# Patient Record
Sex: Male | Born: 1975 | Race: White | Hispanic: No | Marital: Single | Smoking: Never smoker
Health system: Southern US, Community
[De-identification: ages and names within clinical notes are randomized; demographics above are authoritative.]

## PROBLEM LIST (undated history)

## (undated) DIAGNOSIS — E119 Type 2 diabetes mellitus without complications: Secondary | ICD-10-CM

## (undated) DIAGNOSIS — M549 Dorsalgia, unspecified: Secondary | ICD-10-CM

## (undated) DIAGNOSIS — G8929 Other chronic pain: Secondary | ICD-10-CM

## (undated) DIAGNOSIS — J302 Other seasonal allergic rhinitis: Secondary | ICD-10-CM

## (undated) DIAGNOSIS — M1990 Unspecified osteoarthritis, unspecified site: Secondary | ICD-10-CM

## (undated) DIAGNOSIS — M2550 Pain in unspecified joint: Secondary | ICD-10-CM

## (undated) DIAGNOSIS — Z8669 Personal history of other diseases of the nervous system and sense organs: Secondary | ICD-10-CM

## (undated) DIAGNOSIS — M2540 Effusion, unspecified joint: Secondary | ICD-10-CM

## (undated) HISTORY — DX: Other seasonal allergic rhinitis: J30.2

## (undated) HISTORY — DX: Unspecified osteoarthritis, unspecified site: M19.90

## (undated) HISTORY — DX: Type 2 diabetes mellitus without complications: E11.9

---

## 1998-12-18 HISTORY — PX: WRIST SURGERY: SHX841

## 2011-12-05 ENCOUNTER — Encounter: Payer: Self-pay | Admitting: Family Medicine

## 2011-12-05 ENCOUNTER — Ambulatory Visit (INDEPENDENT_AMBULATORY_CARE_PROVIDER_SITE_OTHER): Payer: PRIVATE HEALTH INSURANCE | Admitting: Family Medicine

## 2011-12-05 VITALS — BP 110/92 | HR 94 | Temp 98.7°F | Resp 12 | Ht 69.5 in | Wt 286.0 lb

## 2011-12-05 DIAGNOSIS — G43909 Migraine, unspecified, not intractable, without status migrainosus: Secondary | ICD-10-CM

## 2011-12-05 DIAGNOSIS — Z23 Encounter for immunization: Secondary | ICD-10-CM

## 2011-12-05 DIAGNOSIS — Z0000 Encounter for general adult medical examination without abnormal findings: Secondary | ICD-10-CM

## 2011-12-05 DIAGNOSIS — K219 Gastro-esophageal reflux disease without esophagitis: Secondary | ICD-10-CM

## 2011-12-05 LAB — BASIC METABOLIC PANEL
BUN: 16 mg/dL (ref 6–23)
CO2: 26 mEq/L (ref 19–32)
Chloride: 107 mEq/L (ref 96–112)
Creatinine, Ser: 0.8 mg/dL (ref 0.4–1.5)

## 2011-12-05 LAB — CBC WITH DIFFERENTIAL/PLATELET
Eosinophils Relative: 3.3 % (ref 0.0–5.0)
HCT: 45.3 % (ref 39.0–52.0)
Hemoglobin: 15.7 g/dL (ref 13.0–17.0)
Lymphocytes Relative: 25.1 % (ref 12.0–46.0)
Lymphs Abs: 1.9 10*3/uL (ref 0.7–4.0)
Monocytes Relative: 8.2 % (ref 3.0–12.0)
Platelets: 284 10*3/uL (ref 150.0–400.0)
WBC: 7.4 10*3/uL (ref 4.5–10.5)

## 2011-12-05 LAB — HEPATIC FUNCTION PANEL
AST: 21 U/L (ref 0–37)
Albumin: 4.2 g/dL (ref 3.5–5.2)
Alkaline Phosphatase: 56 U/L (ref 39–117)
Bilirubin, Direct: 0 mg/dL (ref 0.0–0.3)

## 2011-12-05 LAB — LIPID PANEL: Total CHOL/HDL Ratio: 6

## 2011-12-05 NOTE — Progress Notes (Signed)
  Subjective:    Patient ID: Shane Brewer, male    DOB: 05-Jan-1976, 35 y.o.   MRN: [id number]  HPI  New to establish care for well visit. Past medical history reviewed. History of GERD which is controlled with over-the-counter medications -which he takes rarely. History of migraine headaches which are very rare most recent about 3 years ago. Prior history of scaphoid fracture requiring surgery in 2000. Takes no medications. No known drug allergies. No history of smoking. No consistent alcohol use.  Family history significant for both parents with osteoarthritis. Paternal grandfather with heart disease in his 55s.  Patient is single. 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 No consistent exercise.  Past Medical History  Diagnosis Date  . Arthritis   . GERD (gastroesophageal reflux disease)   . Migraine    Past Surgical History  Procedure Date  . Wrist surgery 2000    right wrist injury    reports that he has never smoked. He does not have any smokeless tobacco history on file. His alcohol and drug histories not on file. family history includes Alcohol abuse in his maternal uncle and paternal grandfather; Arthritis in his father and mother; Diabetes in his maternal grandmother; Heart disease in his maternal grandmother and paternal grandfather; Hypertension in his maternal grandmother and paternal grandfather; and Stroke in his maternal grandmother. No Known Allergies    Review of Systems  Constitutional: Negative for fever, activity change, appetite change and fatigue.  HENT: Negative for ear pain, congestion and trouble swallowing.   Eyes: Negative for pain and visual disturbance.  Respiratory: Negative for cough, shortness of breath and wheezing.   Cardiovascular: Negative for chest pain and palpitations.  Gastrointestinal: Negative for nausea, vomiting, abdominal pain, diarrhea, constipation, blood in stool, abdominal distention and rectal pain.  Genitourinary: Negative for dysuria,  hematuria and testicular pain.  Musculoskeletal: Negative for joint swelling and arthralgias.  Skin: Negative for rash.  Neurological: Negative for dizziness, syncope and headaches.  Hematological: Negative for adenopathy.  Psychiatric/Behavioral: Negative for confusion and dysphoric mood.       Objective:   Physical Exam  Constitutional: He is oriented to person, place, and time. He appears well-developed and well-nourished. No distress.  HENT:  Head: Normocephalic and atraumatic.  Right Ear: External ear normal.  Left Ear: External ear normal.  Mouth/Throat: Oropharynx is clear and moist.  Eyes: Conjunctivae and EOM are normal. Pupils are equal, round, and reactive to light.  Neck: Normal range of motion. Neck supple. No thyromegaly present.  Cardiovascular: Normal rate, regular rhythm and normal heart sounds.   No murmur heard. Pulmonary/Chest: No respiratory distress. He has no wheezes. He has no rales.  Abdominal: Soft. Bowel sounds are normal. He exhibits no distension and no mass. There is no tenderness. There is no rebound and no guarding.  Musculoskeletal: He exhibits no edema.  Lymphadenopathy:    He has no cervical adenopathy.  Neurological: He is alert and oriented to person, place, and time. He displays normal reflexes. No cranial nerve deficit.  Skin: No rash noted.  Psychiatric: He has a normal mood and affect.          Assessment & Plan:  Complete physical. Establish baseline labs. Work on weight loss and more consistent exercise. Flu vaccine given.

## 2011-12-05 NOTE — Patient Instructions (Signed)
Work on establishing regular exercise and weight loss.

## 2011-12-07 LAB — TSH: TSH: 1.18 u[IU]/mL (ref 0.35–5.50)

## 2011-12-11 ENCOUNTER — Other Ambulatory Visit: Payer: Self-pay | Admitting: Family Medicine

## 2011-12-11 DIAGNOSIS — E785 Hyperlipidemia, unspecified: Secondary | ICD-10-CM

## 2011-12-11 NOTE — Progress Notes (Signed)
Quick Note:  Pt informed on VM, labs ordered ______

## 2011-12-28 ENCOUNTER — Encounter: Payer: Self-pay | Admitting: Family Medicine

## 2011-12-28 ENCOUNTER — Ambulatory Visit (INDEPENDENT_AMBULATORY_CARE_PROVIDER_SITE_OTHER): Payer: PRIVATE HEALTH INSURANCE | Admitting: Family Medicine

## 2011-12-28 VITALS — BP 110/80 | Temp 98.8°F | Wt 276.0 lb

## 2011-12-28 DIAGNOSIS — M722 Plantar fascial fibromatosis: Secondary | ICD-10-CM

## 2011-12-28 MED ORDER — DICLOFENAC SODIUM 75 MG PO TBEC: 75.0000 mg | DELAYED_RELEASE_TABLET | Freq: Two times a day (BID) | ORAL | Status: DC

## 2011-12-28 NOTE — Progress Notes (Signed)
  Subjective:    Patient ID: Shane Brewer, male    DOB: 1976-03-18, 36 y.o.   MRN: [id number]  HPI  Acute visit. 3 week history left foot pain. Location is plantar fascia along the mid to distal portion. No injury. Using arch supports with minimal relief. No redness or bruising. Tends to wear very supportive shoes. No history of similar problem. Has not tried any icing. Aleve without much relief   Review of Systems As per history of present illness    Objective:   Physical Exam  Constitutional: He appears well-developed and well-nourished.  Cardiovascular: Normal rate and regular rhythm.   Pulmonary/Chest: Effort normal and breath sounds normal. No respiratory distress. He has no wheezes. He has no rales.  Musculoskeletal: He exhibits no edema.       Left foot reveals no edema. Mild tenderness mid plantar fascia. No palpable defects or swelling. No ecchymosis. Achilles intact. No dorsal foot pain          Assessment & Plan:  Left mid plantar fascia foot pain. Doubt rupture. Continue inserts for support. Diclofenac 75 mgs twice a day with food. Touch base one week if no better

## 2011-12-28 NOTE — Patient Instructions (Signed)
Continue with arch supports. Be in touch in 1-2 weeks if no better.

## 2012-05-23 ENCOUNTER — Ambulatory Visit (INDEPENDENT_AMBULATORY_CARE_PROVIDER_SITE_OTHER): Payer: PRIVATE HEALTH INSURANCE | Admitting: Family Medicine

## 2012-05-23 ENCOUNTER — Encounter: Payer: Self-pay | Admitting: Family Medicine

## 2012-05-23 VITALS — BP 100/72 | Temp 97.6°F | Wt 289.0 lb

## 2012-05-23 DIAGNOSIS — D485 Neoplasm of uncertain behavior of skin: Secondary | ICD-10-CM

## 2012-05-23 NOTE — Progress Notes (Signed)
  Subjective:    Patient ID: Shane Brewer, male    DOB: 05/23/1976, 36 y.o.   MRN: [id number]  HPI  Irritated mole right side. He states was brownish in color 2 days ago noticed some soreness and redness. No known injury. No active bleeding. No history of skin cancer. No other concerning lesions noted.   Review of Systems  Constitutional: Negative for appetite change and unexpected weight change.  Hematological: Negative for adenopathy.       Objective:   Physical Exam  Constitutional: He appears well-developed and well-nourished.  Cardiovascular: Normal rate and regular rhythm.   Pulmonary/Chest: Effort normal and breath sounds normal. No respiratory distress. He has no wheezes. He has no rales.  Skin:       Right anterior to lateral rib cage area reveals approximately 3 mm slightly raised well demarcated erythematous nodular lesion. Good symmetry          Assessment & Plan:  Irritated skin lesion. Question angioma versus traumatized benign nevus Discussed risk and benefits of shave excision and patient consented. Prepped with Betadine. Anesthesia 1% Xylocaine with epinephrine. Shave with #15 blade. Minimal bleeding. Topical antibiotic and dressing applied. Specimen sent to pathologist for further evaluation

## 2012-05-23 NOTE — Patient Instructions (Signed)
Keep wound dry for the first 24 hours then clean daily with soap and water for one week. Apply topical antibiotic daily for 3-4 days. Keep covered with clean dressing for 4-5 days. Follow up promptly for any signs of infection such as redness, warmth, pain, or drainage.  

## 2012-05-28 NOTE — Progress Notes (Signed)
Quick Note:  Pt informed on personally identified VM ______ 

## 2012-08-17 ENCOUNTER — Ambulatory Visit: Payer: Self-pay | Admitting: Internal Medicine

## 2013-02-04 ENCOUNTER — Encounter: Payer: Self-pay | Admitting: Internal Medicine

## 2013-02-04 ENCOUNTER — Ambulatory Visit (INDEPENDENT_AMBULATORY_CARE_PROVIDER_SITE_OTHER): Payer: PRIVATE HEALTH INSURANCE | Admitting: Internal Medicine

## 2013-02-04 VITALS — BP 122/84 | Temp 97.9°F | Wt 291.0 lb

## 2013-02-04 DIAGNOSIS — K5289 Other specified noninfective gastroenteritis and colitis: Secondary | ICD-10-CM

## 2013-02-04 DIAGNOSIS — K529 Noninfective gastroenteritis and colitis, unspecified: Secondary | ICD-10-CM

## 2013-02-04 MED ORDER — ONDANSETRON HCL 4 MG PO TABS: 4.0000 mg | ORAL_TABLET | Freq: Three times a day (TID) | ORAL | Status: DC | PRN

## 2013-02-04 NOTE — Patient Instructions (Addendum)
Increase fluid intake and follow bland diet for next 3-5 days. Please contact our office if your symptoms do not improve or gets worse.

## 2013-02-04 NOTE — Assessment & Plan Note (Signed)
37 year old white male with signs and symptoms of viral gastroenteritis. 4 episodes of vomiting today. He denies any diarrhea. Treat with Zofran 4 mg every 8 hours as needed. Patient advised push fluids and follow bland diet.  Patient advised to call office if symptoms persist or worsen.

## 2013-02-04 NOTE — Progress Notes (Signed)
  Subjective:    Patient ID: Shane Brewer, male    DOB: 03/31/1976, 37 y.o.   MRN: [id number]  HPI  37 year old white male with history of GERD and obesity complains of nausea and vomiting for 24 hours. He has vomited 4 times today.  Patient denies any associated abdominal pain. He denies any diarrhea.  Patient reports he was diagnosed with influenza 2 weeks ago. His symptoms improved with using course of Tamiflu.  He denies any unusual food intake. He denies any recent travel.  Review of Systems Negative for fever chills, negative for diarrhea    Past Medical History  Diagnosis Date  . Arthritis   . GERD (gastroesophageal reflux disease)   . Migraine     History   Social History  . Marital Status: Single    Spouse Name: N/A    Number of Children: N/A  . Years of Education: N/A   Occupational History  . Not on file.   Social History Main Topics  . Smoking status: Never Smoker   . Smokeless tobacco: Not on file  . Alcohol Use: Not on file  . Drug Use: Not on file  . Sexually Active: Not on file   Other Topics Concern  . Not on file   Social History Narrative  . No narrative on file    Past Surgical History  Procedure Laterality Date  . Wrist surgery  2000    right wrist injury    Family History  Problem Relation Age of Onset  . Arthritis Mother   . Arthritis Father   . Alcohol abuse Maternal Uncle   . Heart disease Maternal Grandmother   . Hypertension Maternal Grandmother   . Stroke Maternal Grandmother   . Diabetes Maternal Grandmother   . Heart disease Paternal Grandfather   . Hypertension Paternal Grandfather   . Alcohol abuse Paternal Grandfather     No Known Allergies  No current outpatient prescriptions on file prior to visit.   No current facility-administered medications on file prior to visit.    BP 122/84  Temp(Src) 97.9 F (36.6 C) (Oral)  Wt 291 lb (131.997 kg)  BMI 42.37 kg/m2    Objective:   Physical Exam   Constitutional: He is oriented to person, place, and time.  Pleasant, obese 37 y/o  HENT:  Head: Normocephalic and atraumatic.  Right Ear: External ear normal.  Left Ear: External ear normal.  Mouth/Throat: Oropharynx is clear and moist.  Cardiovascular: Normal rate and normal heart sounds.   Pulmonary/Chest: Effort normal and breath sounds normal. He has no wheezes.  Abdominal: Bowel sounds are normal. He exhibits no mass. There is no tenderness.  Neurological: He is alert and oriented to person, place, and time. No cranial nerve deficit.  Psychiatric: He has a normal mood and affect. His behavior is normal.          Assessment & Plan:

## 2013-04-22 ENCOUNTER — Other Ambulatory Visit (INDEPENDENT_AMBULATORY_CARE_PROVIDER_SITE_OTHER): Payer: PRIVATE HEALTH INSURANCE

## 2013-04-22 DIAGNOSIS — Z0000 Encounter for general adult medical examination without abnormal findings: Secondary | ICD-10-CM

## 2013-04-22 LAB — CBC WITH DIFFERENTIAL/PLATELET
Basophils Absolute: 0 10*3/uL (ref 0.0–0.1)
Eosinophils Absolute: 0.2 10*3/uL (ref 0.0–0.7)
Lymphocytes Relative: 26.4 % (ref 12.0–46.0)
MCHC: 34.6 g/dL (ref 30.0–36.0)
MCV: 86.9 fl (ref 78.0–100.0)
Monocytes Absolute: 0.7 10*3/uL (ref 0.1–1.0)
Neutrophils Relative %: 64.4 % (ref 43.0–77.0)
Platelets: 308 10*3/uL (ref 150.0–400.0)

## 2013-04-22 LAB — POCT URINALYSIS DIPSTICK
Bilirubin, UA: NEGATIVE
Blood, UA: NEGATIVE
Ketones, UA: NEGATIVE
Leukocytes, UA: NEGATIVE
Spec Grav, UA: 1.025
pH, UA: 5.5

## 2013-04-22 LAB — LIPID PANEL
Cholesterol: 206 mg/dL — ABNORMAL HIGH (ref 0–200)
HDL: 33.4 mg/dL — ABNORMAL LOW (ref >39.00)
Triglycerides: 303 mg/dL — ABNORMAL HIGH (ref 0.0–149.0)

## 2013-04-22 LAB — HEPATIC FUNCTION PANEL
AST: 19 U/L (ref 0–37)
Alkaline Phosphatase: 56 U/L (ref 39–117)
Bilirubin, Direct: 0.1 mg/dL (ref 0.0–0.3)
Total Protein: 7.1 g/dL (ref 6.0–8.3)

## 2013-04-22 LAB — BASIC METABOLIC PANEL
BUN: 10 mg/dL (ref 6–23)
CO2: 26 mEq/L (ref 19–32)
Calcium: 8.6 mg/dL (ref 8.4–10.5)
Chloride: 104 mEq/L (ref 96–112)
Creatinine, Ser: 0.8 mg/dL (ref 0.4–1.5)

## 2013-04-28 ENCOUNTER — Encounter: Payer: Self-pay | Admitting: Family Medicine

## 2013-04-28 ENCOUNTER — Ambulatory Visit (INDEPENDENT_AMBULATORY_CARE_PROVIDER_SITE_OTHER): Payer: PRIVATE HEALTH INSURANCE | Admitting: Family Medicine

## 2013-04-28 VITALS — BP 110/72 | HR 72 | Temp 99.3°F | Resp 12 | Ht 70.0 in | Wt 292.0 lb

## 2013-04-28 DIAGNOSIS — Z0000 Encounter for general adult medical examination without abnormal findings: Secondary | ICD-10-CM

## 2013-04-28 NOTE — Patient Instructions (Addendum)
Try to establish more consistent exercise and lose some weight. 

## 2013-04-28 NOTE — Progress Notes (Signed)
  Subjective:    Patient ID: Shane Brewer, male    DOB: [birth year], 37 y.o.   MRN: [id number]  HPI Patient seen for complete physical He has history of obesity and intermittent GERD symptoms usually controlled with over-the-counter medications Rare migraine headaches but none in several months No regular exercise. Nonsmoker. Very stressful job of Orthoptist  Past Medical History  Diagnosis Date  . Arthritis   . GERD (gastroesophageal reflux disease)   . Migraine    Past Surgical History  Procedure Laterality Date  . Wrist surgery  2000    right wrist injury    reports that he has never smoked. He does not have any smokeless tobacco history on file. His alcohol and drug histories are not on file. family history includes Alcohol abuse in his maternal uncle and paternal grandfather; Arthritis in his father and mother; Diabetes in his maternal grandmother; Heart disease in his maternal grandmother and paternal grandfather; Hypertension in his father, maternal grandmother, and paternal grandfather; and Stroke in his maternal grandmother. No Known Allergies    Review of Systems  Constitutional: Negative for fever, activity change, appetite change and fatigue.  HENT: Negative for ear pain, congestion and trouble swallowing.   Eyes: Negative for pain and visual disturbance.  Respiratory: Negative for cough, shortness of breath and wheezing.   Cardiovascular: Negative for chest pain and palpitations.  Gastrointestinal: Negative for nausea, vomiting, abdominal pain, diarrhea, constipation, blood in stool, abdominal distention and rectal pain.  Genitourinary: Negative for dysuria, hematuria and testicular pain.  Musculoskeletal: Negative for joint swelling and arthralgias.  Skin: Negative for rash.  Neurological: Negative for dizziness, syncope and headaches.  Hematological: Negative for adenopathy.  Psychiatric/Behavioral: Negative for confusion and dysphoric mood.        Objective:   Physical Exam  Constitutional: He is oriented to person, place, and time. He appears well-developed and well-nourished. No distress.  HENT:  Head: Normocephalic and atraumatic.  Right Ear: External ear normal.  Left Ear: External ear normal.  Mouth/Throat: Oropharynx is clear and moist.  Eyes: Conjunctivae and EOM are normal. Pupils are equal, round, and reactive to light.  Neck: Normal range of motion. Neck supple. No thyromegaly present.  Cardiovascular: Normal rate, regular rhythm and normal heart sounds.   No murmur heard. Pulmonary/Chest: No respiratory distress. He has no wheezes. He has no rales.  Abdominal: Soft. Bowel sounds are normal. He exhibits no distension and no mass. There is no tenderness. There is no rebound and no guarding.  Musculoskeletal: He exhibits no edema.  Lymphadenopathy:    He has no cervical adenopathy.  Neurological: He is alert and oriented to person, place, and time. He displays normal reflexes. No cranial nerve deficit.  Skin: No rash noted.  Psychiatric: He has a normal mood and affect.          Assessment & Plan:  Complete physical.   Labs reviewed with patient. He has prediabetes. Needs to lose some weight. We discussed diet and exercise. Establish more consistent exercise program.

## 2013-06-23 ENCOUNTER — Ambulatory Visit (INDEPENDENT_AMBULATORY_CARE_PROVIDER_SITE_OTHER): Payer: PRIVATE HEALTH INSURANCE | Admitting: Family Medicine

## 2013-06-23 ENCOUNTER — Encounter: Payer: Self-pay | Admitting: Family Medicine

## 2013-06-23 VITALS — BP 112/88 | HR 89 | Temp 98.1°F | Resp 18 | Wt 297.0 lb

## 2013-06-23 DIAGNOSIS — M79609 Pain in unspecified limb: Secondary | ICD-10-CM

## 2013-06-23 DIAGNOSIS — M79671 Pain in right foot: Secondary | ICD-10-CM

## 2013-06-23 MED ORDER — DICLOFENAC SODIUM 75 MG PO TBEC: 75.0000 mg | DELAYED_RELEASE_TABLET | Freq: Two times a day (BID) | ORAL | Status: DC

## 2013-06-23 NOTE — Progress Notes (Signed)
  Subjective:    Patient ID: Shane Brewer, male    DOB: Apr 29, 1976, 37 y.o.   MRN: [id number]  HPI Right great toe pain. No recent injury. About 2 weeks ago with squatting and did notice a popping sensation in the toe around the metatarsophalangeal joint. Has not noted any redness or bruising or warmth. No history of gout. Pain with ambulation. Pain mostly centered around the metatarsophalangeal joint. No relief with naproxen. No recent change of shoe wear  Past Medical History  Diagnosis Date  . Arthritis   . GERD (gastroesophageal reflux disease)   . Migraine    Past Surgical History  Procedure Laterality Date  . Wrist surgery  2000    right wrist injury    reports that he has never smoked. He does not have any smokeless tobacco history on file. His alcohol and drug histories are not on file. family history includes Alcohol abuse in his maternal uncle and paternal grandfather; Arthritis in his father and mother; Diabetes in his maternal grandmother; Heart disease in his maternal grandmother and paternal grandfather; Hypertension in his father, maternal grandmother, and paternal grandfather; and Stroke in his maternal grandmother. No Known Allergies     Review of Systems  Constitutional: Negative for fever and chills.  Skin: Negative for rash.       Objective:   Physical Exam  Constitutional: He appears well-developed and well-nourished.  Cardiovascular: Normal rate and regular rhythm.   Musculoskeletal:  Right foot reveals no edema. No ecchymosis. No warmth. No erythema. Tenderness MTP joint.          Assessment & Plan:  Right foot pain. Metatarsophalangeal joint. Question osteoarthritis. No evidence for acute inflammation such as gout. Trial of diclofenac 75 mg twice a day with food. X-rays right foot

## 2013-06-24 ENCOUNTER — Ambulatory Visit (INDEPENDENT_AMBULATORY_CARE_PROVIDER_SITE_OTHER)
Admission: RE | Admit: 2013-06-24 | Discharge: 2013-06-24 | Disposition: A | Discharge status: Home / Self Care | Payer: PRIVATE HEALTH INSURANCE | Source: Ambulatory Visit | Attending: Family Medicine | Admitting: Family Medicine

## 2013-06-24 DIAGNOSIS — M79671 Pain in right foot: Secondary | ICD-10-CM

## 2013-06-24 DIAGNOSIS — M79609 Pain in unspecified limb: Secondary | ICD-10-CM

## 2013-07-19 ENCOUNTER — Encounter (HOSPITAL_COMMUNITY): Payer: Self-pay

## 2013-07-19 ENCOUNTER — Emergency Department (HOSPITAL_COMMUNITY)
Admission: EM | Admit: 2013-07-19 | Discharge: 2013-07-19 | Disposition: A | Discharge status: Home / Self Care | Payer: PRIVATE HEALTH INSURANCE | Attending: Emergency Medicine | Admitting: Emergency Medicine

## 2013-07-19 DIAGNOSIS — Y929 Unspecified place or not applicable: Secondary | ICD-10-CM | POA: Insufficient documentation

## 2013-07-19 DIAGNOSIS — L089 Local infection of the skin and subcutaneous tissue, unspecified: Secondary | ICD-10-CM | POA: Insufficient documentation

## 2013-07-19 DIAGNOSIS — Z79899 Other long term (current) drug therapy: Secondary | ICD-10-CM | POA: Insufficient documentation

## 2013-07-19 DIAGNOSIS — Z8679 Personal history of other diseases of the circulatory system: Secondary | ICD-10-CM | POA: Insufficient documentation

## 2013-07-19 DIAGNOSIS — Z8739 Personal history of other diseases of the musculoskeletal system and connective tissue: Secondary | ICD-10-CM | POA: Insufficient documentation

## 2013-07-19 DIAGNOSIS — Y939 Activity, unspecified: Secondary | ICD-10-CM | POA: Insufficient documentation

## 2013-07-19 DIAGNOSIS — W57XXXA Bitten or stung by nonvenomous insect and other nonvenomous arthropods, initial encounter: Secondary | ICD-10-CM

## 2013-07-19 DIAGNOSIS — L0231 Cutaneous abscess of buttock: Secondary | ICD-10-CM | POA: Insufficient documentation

## 2013-07-19 DIAGNOSIS — S30860A Insect bite (nonvenomous) of lower back and pelvis, initial encounter: Secondary | ICD-10-CM

## 2013-07-19 DIAGNOSIS — Z8719 Personal history of other diseases of the digestive system: Secondary | ICD-10-CM | POA: Insufficient documentation

## 2013-07-19 NOTE — ED Provider Notes (Signed)
Medical screening examination/treatment/procedure(s) were performed by non-physician practitioner and as supervising physician I was immediately available for consultation/collaboration.   Gilda Crease, MD 07/19/13 [phone]

## 2013-07-19 NOTE — ED Notes (Signed)
Pt ambulatory to exam room with steady gait. Pt has small reddened area to top of R buttock. Pt states area burns a little. Pt states he thinks a bug bit him.

## 2013-07-19 NOTE — ED Notes (Signed)
Pt c/o possible spider bite to RT buttock since yesterday.  Denies fever symptoms other than pain.

## 2013-07-19 NOTE — ED Provider Notes (Signed)
CSN: [id number]     Arrival date & time 07/19/13  1525 History  This chart was scribed for non-physician practitioner, Junius Finner, PA-C working with Gilda Crease, MD by Greggory Stallion, ED scribe. This patient was seen in room WTR8/WTR8 and the patient's care was started at 4:58 PM.   Chief Complaint  Patient presents with  . Abscess   The history is provided by the patient. No language interpreter was used.    HPI Comments: Shane Brewer is a 37 y.o. male who presents to the Emergency Department complaining of gradual onset, constant abscess due to a possible spider bite to his right buttock that he noticed yesterday. He states it is sore, burning, constant, 5/10 in right buttock at area of bite. Pt denies fever, nausea and emesis as associated symptoms. He states he hasn't taken anything. No allergies that pt knows of.    Past Medical History  Diagnosis Date  . Arthritis   . GERD (gastroesophageal reflux disease)   . Migraine    Past Surgical History  Procedure Laterality Date  . Wrist surgery  2000    right wrist injury   Family History  Problem Relation Age of Onset  . Arthritis Mother   . Arthritis Father   . Hypertension Father   . Alcohol abuse Maternal Uncle   . Heart disease Maternal Grandmother   . Hypertension Maternal Grandmother   . Stroke Maternal Grandmother   . Diabetes Maternal Grandmother   . Heart disease Paternal Grandfather   . Hypertension Paternal Grandfather   . Alcohol abuse Paternal Grandfather    History  Substance Use Topics  . Smoking status: Never Smoker   . Smokeless tobacco: Not on file  . Alcohol Use: Yes     Comment: occasionally    Review of Systems  Constitutional: Negative for fever.  Gastrointestinal: Negative for nausea and vomiting.  Skin:       Abscess   All other systems reviewed and are negative.    Allergies  Strawberry  Home Medications   Current Outpatient Rx  Name  Route  Sig  Dispense  Refill  .  Multiple Vitamin (MULTIVITAMIN WITH MINERALS) TABS   Oral   Take 1 tablet by mouth daily.          BP 139/86  Pulse 103  Temp(Src) 98.7 F (37.1 C) (Oral)  Resp 18  SpO2 100%  Physical Exam  Nursing note and vitals reviewed. Constitutional: He appears well-developed and well-nourished.  HENT:  Head: Normocephalic and atraumatic.  Eyes: Conjunctivae are normal. No scleral icterus.  Neck: Normal range of motion.  Cardiovascular: Normal rate, regular rhythm and normal heart sounds.   Pulmonary/Chest: Effort normal and breath sounds normal. No respiratory distress. He has no wheezes. He has no rales. He exhibits no tenderness.  Abdominal: Soft. Bowel sounds are normal. He exhibits no distension and no mass. There is no tenderness. There is no rebound and no guarding.  Musculoskeletal: Normal range of motion.  Neurological: He is alert.  Skin: Skin is warm and dry. There is erythema.       ED Course   Procedures (including critical care time)  DIAGNOSTIC STUDIES: Oxygen Saturation is 100% on RA, normal by my interpretation.    COORDINATION OF CARE: 5:02 PM-Discussed treatment plan which includes Benadryl and Tylenol or ibuprofen for pain with pt at bedside and pt agreed to plan. Advised pt to come back to the ED if symptoms worsen.   Labs Reviewed -  No data to display No results found. 1. Insect bite of buttock with local reaction, initial encounter     MDM  Believe area on right buttock is local reaction to insect bite.  Do not believe I&D or prescription tx is needed at this time.  Advised pt to use OTC benadryl as well as tylenol and ibuprofen as needed for pain.  Return precautions given.  Pt verbalized understanding and agreement with tx plan.   I personally performed the services described in this documentation, which was scribed in my presence. The recorded information has been reviewed and is accurate.    Junius Finner, PA-C 07/19/13 1712

## 2014-02-16 ENCOUNTER — Encounter: Payer: Self-pay | Admitting: Family Medicine

## 2014-02-16 ENCOUNTER — Ambulatory Visit (INDEPENDENT_AMBULATORY_CARE_PROVIDER_SITE_OTHER): Payer: PRIVATE HEALTH INSURANCE | Admitting: Family Medicine

## 2014-02-16 VITALS — BP 124/70 | HR 90 | Wt 291.0 lb

## 2014-02-16 DIAGNOSIS — L02619 Cutaneous abscess of unspecified foot: Secondary | ICD-10-CM

## 2014-02-16 DIAGNOSIS — L03039 Cellulitis of unspecified toe: Secondary | ICD-10-CM

## 2014-02-16 DIAGNOSIS — L03032 Cellulitis of left toe: Secondary | ICD-10-CM

## 2014-02-16 MED ORDER — CEPHALEXIN 500 MG PO CAPS: 500.0000 mg | ORAL_CAPSULE | Freq: Three times a day (TID) | ORAL | Status: DC

## 2014-02-16 NOTE — Progress Notes (Signed)
   Subjective:    Patient ID: Shane Brewer, male    DOB: 1976/12/04, 38 y.o.   MRN: [id number]  HPI  Patient seen with a painful left great toe. Few weeks ago he dropped a frozen bag of vegetables on his toe. He did not notice any bruising. He's had some soreness along the border next his second toe since then. He had some mild redness and minimal drainage over the past week. He was concerned about ingrown nail and tried to dig this out himself. He has not done any saltwater soaks. No fevers or chills. He has not noted any bony tenderness and no significant pain with ambulation.  Past Medical History  Diagnosis Date  . Arthritis   . GERD (gastroesophageal reflux disease)   . Migraine    Past Surgical History  Procedure Laterality Date  . Wrist surgery  2000    right wrist injury    reports that he has never smoked. He does not have any smokeless tobacco history on file. He reports that he drinks alcohol. He reports that he does not use illicit drugs. family history includes Alcohol abuse in his maternal uncle and paternal grandfather; Arthritis in his father and mother; Diabetes in his maternal grandmother; Heart disease in his maternal grandmother and paternal grandfather; Hypertension in his father, maternal grandmother, and paternal grandfather; Stroke in his maternal grandmother. Allergies  Allergen Reactions  . Strawberry       Review of Systems  Constitutional: Negative for fever and chills.       Objective:   Physical Exam  Constitutional: He appears well-developed and well-nourished.  Cardiovascular: Normal rate.   Skin:  Left great toe reveals mild swelling mild erythema and tenderness along the border next to the second toe. No evidence for paronychia. He has a little bit of crusted drainage in this region. Moderately tender to palpation. No bony tenderness.          Assessment & Plan:  Probable early cellulitis left great toe. He has evidence for minimally  ingrown border. Warm salt water soaks several times daily. Keflex 500 mg 3 times a day for 10 days. If no better in 2 weeks, consider followup for partial nail excision

## 2014-02-16 NOTE — Patient Instructions (Signed)
Warm salt water soaks couple of times daily. Follow up 2 weeks if no better.

## 2014-02-16 NOTE — Progress Notes (Signed)
Pre visit review using our clinic review tool, if applicable. No additional management support is needed unless otherwise documented below in the visit note. 

## 2014-02-25 ENCOUNTER — Telehealth: Payer: Self-pay | Admitting: Family Medicine

## 2014-02-25 NOTE — Telephone Encounter (Signed)
Pt was seen on 02/16/14 states dr. Sarajane Jews informed him to call back if condition was still the same. Pt states nothing has changed and he need to be advised on what to do.

## 2014-02-25 NOTE — Telephone Encounter (Signed)
Pt states that toe is still swollen and he only has two pills left and it is still sore.

## 2014-02-25 NOTE — Telephone Encounter (Signed)
Will probably need partial nail excision.  Go ahead and schedule for 30 minute follow up

## 2014-02-26 NOTE — Telephone Encounter (Signed)
Pt informed and is setting up appt.

## 2014-03-03 ENCOUNTER — Ambulatory Visit: Payer: PRIVATE HEALTH INSURANCE | Admitting: Family Medicine

## 2014-03-05 ENCOUNTER — Encounter: Payer: Self-pay | Admitting: Family Medicine

## 2014-03-05 ENCOUNTER — Ambulatory Visit (INDEPENDENT_AMBULATORY_CARE_PROVIDER_SITE_OTHER): Payer: PRIVATE HEALTH INSURANCE | Admitting: Family Medicine

## 2014-03-05 VITALS — BP 122/82 | HR 88 | Wt 297.0 lb

## 2014-03-05 DIAGNOSIS — L600 Ingrowing nail: Secondary | ICD-10-CM

## 2014-03-05 NOTE — Progress Notes (Signed)
Pre visit review using our clinic review tool, if applicable. No additional management support is needed unless otherwise documented below in the visit note. 

## 2014-03-05 NOTE — Progress Notes (Signed)
   Subjective:    Patient ID: Shane Brewer, male    DOB: 1976-05-02, 38 y.o.   MRN: [id number]  Toe Pain    Persistent pain and swelling left great toe. He has an ingrown toenail. We recently placed him on antibiotics and with warm soaks his symptoms did improve slightly but he still has considerable pain with walking. He has had some redness and persistent pain involving the border of the left great toe next to the second toe. We discussed possible partial nail excision this point if not improving with salt water soaks and antibiotics. He's had progressive symptoms over several weeks  Past Medical History  Diagnosis Date  . Arthritis   . GERD (gastroesophageal reflux disease)   . Migraine    Past Surgical History  Procedure Laterality Date  . Wrist surgery  2000    right wrist injury    reports that he has never smoked. He does not have any smokeless tobacco history on file. He reports that he drinks alcohol. He reports that he does not use illicit drugs. family history includes Alcohol abuse in his maternal uncle and paternal grandfather; Arthritis in his father and mother; Diabetes in his maternal grandmother; Heart disease in his maternal grandmother and paternal grandfather; Hypertension in his father, maternal grandmother, and paternal grandfather; Stroke in his maternal grandmother. Allergies  Allergen Reactions  . Strawberry       Review of Systems  Constitutional: Negative for fever and chills.       Objective:   Physical Exam  Constitutional: He appears well-developed and well-nourished.  Cardiovascular: Normal rate and regular rhythm.   Pulmonary/Chest: Effort normal and breath sounds normal. No respiratory distress. He has no wheezes.  Musculoskeletal:  Left great toe reveals some mild swelling and erythema along the border next the second toe. There is no granulation tissue and no purulent drainage. Moderately tender to palpation          Assessment & Plan:   Ingrown left great toenail. We discussed risk and benefits of digital block and partial nail excision. Patient consented. Toe prepped with Betadine. Digital block with 1% plain Xylocaine. After obtaining full block we used straight hemostats and freed up involved border of nail. Then using scissors removed one third of involved nail. Minimal bleeding. Topical antibiotic and dressing applied. Wound care instruction given.

## 2014-03-05 NOTE — Patient Instructions (Signed)
Keep toe dry for 24 hours then clean daily with soap and water Follow up for signs of infection such as redness, pus drainage, or increased pain.

## 2014-04-27 ENCOUNTER — Other Ambulatory Visit (INDEPENDENT_AMBULATORY_CARE_PROVIDER_SITE_OTHER): Payer: PRIVATE HEALTH INSURANCE

## 2014-04-27 DIAGNOSIS — Z0000 Encounter for general adult medical examination without abnormal findings: Secondary | ICD-10-CM

## 2014-04-27 LAB — LIPID PANEL
CHOL/HDL RATIO: 5
Cholesterol: 192 mg/dL (ref 0–200)
HDL: 39.9 mg/dL (ref >39.00)
LDL CALC: 119 mg/dL — AB (ref 0–99)
TRIGLYCERIDES: 164 mg/dL — AB (ref 0.0–149.0)
VLDL: 32.8 mg/dL (ref 0.0–40.0)

## 2014-04-27 LAB — POCT URINALYSIS DIPSTICK
Bilirubin, UA: NEGATIVE
Glucose, UA: NEGATIVE
Ketones, UA: NEGATIVE
Leukocytes, UA: NEGATIVE
NITRITE UA: NEGATIVE
PH UA: 6
PROTEIN UA: NEGATIVE
RBC UA: NEGATIVE
UROBILINOGEN UA: 0.2

## 2014-04-27 LAB — HEPATIC FUNCTION PANEL
ALBUMIN: 4.1 g/dL (ref 3.5–5.2)
ALK PHOS: 52 U/L (ref 39–117)
ALT: 33 U/L (ref 0–53)
AST: 22 U/L (ref 0–37)
Bilirubin, Direct: 0 mg/dL (ref 0.0–0.3)
TOTAL PROTEIN: 7.6 g/dL (ref 6.0–8.3)
Total Bilirubin: 0.8 mg/dL (ref 0.2–1.2)

## 2014-04-27 LAB — CBC WITH DIFFERENTIAL/PLATELET
BASOS PCT: 0.3 % (ref 0.0–3.0)
Basophils Absolute: 0 10*3/uL (ref 0.0–0.1)
Eosinophils Absolute: 0.4 10*3/uL (ref 0.0–0.7)
Eosinophils Relative: 3.6 % (ref 0.0–5.0)
HEMATOCRIT: 45.2 % (ref 39.0–52.0)
HEMOGLOBIN: 15.3 g/dL (ref 13.0–17.0)
LYMPHS ABS: 3.2 10*3/uL (ref 0.7–4.0)
LYMPHS PCT: 31.7 % (ref 12.0–46.0)
MCHC: 33.8 g/dL (ref 30.0–36.0)
MCV: 89 fl (ref 78.0–100.0)
MONOS PCT: 8.5 % (ref 3.0–12.0)
Monocytes Absolute: 0.8 10*3/uL (ref 0.1–1.0)
NEUTROS ABS: 5.6 10*3/uL (ref 1.4–7.7)
Neutrophils Relative %: 55.9 % (ref 43.0–77.0)
Platelets: 280 10*3/uL (ref 150.0–400.0)
RBC: 5.08 Mil/uL (ref 4.22–5.81)
RDW: 13.3 % (ref 11.5–15.5)
WBC: 9.9 10*3/uL (ref 4.0–10.5)

## 2014-04-27 LAB — BASIC METABOLIC PANEL
BUN: 22 mg/dL (ref 6–23)
CALCIUM: 9.6 mg/dL (ref 8.4–10.5)
CO2: 31 meq/L (ref 19–32)
Chloride: 103 mEq/L (ref 96–112)
Creatinine, Ser: 0.9 mg/dL (ref 0.4–1.5)
GFR: 104.64 mL/min (ref >60.00)
GLUCOSE: 99 mg/dL (ref 70–99)
POTASSIUM: 4.6 meq/L (ref 3.5–5.1)
SODIUM: 143 meq/L (ref 135–145)

## 2014-04-27 LAB — TSH: TSH: 0.66 u[IU]/mL (ref 0.35–4.50)

## 2014-05-04 ENCOUNTER — Ambulatory Visit (INDEPENDENT_AMBULATORY_CARE_PROVIDER_SITE_OTHER): Payer: PRIVATE HEALTH INSURANCE | Admitting: Family Medicine

## 2014-05-04 ENCOUNTER — Encounter: Payer: Self-pay | Admitting: Family Medicine

## 2014-05-04 VITALS — BP 134/80 | HR 118 | Temp 98.5°F | Ht 70.0 in | Wt 296.0 lb

## 2014-05-04 DIAGNOSIS — Z0000 Encounter for general adult medical examination without abnormal findings: Secondary | ICD-10-CM

## 2014-05-04 NOTE — Progress Notes (Signed)
   Subjective:    Patient ID: Shane Brewer, male    DOB: 13-Jul-1976, 38 y.o.   MRN: [id number]  HPI Patient seen for complete physical. Exercise has been somewhat inconsistent. He has scaled back sugar and starch intake over the past year. Never smoked. Takes no regular medications. Rare GERD symptoms which are controlled with over-the-counter medications. Rare migraine headaches. Generally feels well.  Past Medical History  Diagnosis Date  . Arthritis   . GERD (gastroesophageal reflux disease)   . Migraine    Past Surgical History  Procedure Laterality Date  . Wrist surgery  2000    right wrist injury    reports that he has never smoked. He does not have any smokeless tobacco history on file. He reports that he drinks alcohol. He reports that he does not use illicit drugs. family history includes Alcohol abuse in his maternal uncle and paternal grandfather; Arthritis in his father and mother; Cancer in his paternal grandmother; Diabetes in his maternal grandmother; Heart disease in his maternal grandmother and paternal grandfather; Hypertension in his father, maternal grandmother, and paternal grandfather; Stroke in his maternal grandmother. Allergies  Allergen Reactions  . Strawberry       Review of Systems  Constitutional: Negative for fever, activity change, appetite change and fatigue.  HENT: Negative for congestion, ear pain and trouble swallowing.   Eyes: Negative for pain and visual disturbance.  Respiratory: Negative for cough, shortness of breath and wheezing.   Cardiovascular: Negative for chest pain and palpitations.  Gastrointestinal: Negative for nausea, vomiting, abdominal pain, diarrhea, constipation, blood in stool, abdominal distention and rectal pain.  Endocrine: Negative for polydipsia and polyuria.  Genitourinary: Negative for dysuria, hematuria and testicular pain.  Musculoskeletal: Negative for arthralgias and joint swelling.  Skin: Negative for rash.    Neurological: Negative for dizziness, syncope and headaches.  Hematological: Negative for adenopathy.  Psychiatric/Behavioral: Negative for confusion and dysphoric mood.       Objective:   Physical Exam  Constitutional: He is oriented to person, place, and time. He appears well-developed and well-nourished. No distress.  HENT:  Head: Normocephalic and atraumatic.  Right Ear: External ear normal.  Left Ear: External ear normal.  Mouth/Throat: Oropharynx is clear and moist.  Eyes: Conjunctivae and EOM are normal. Pupils are equal, round, and reactive to light.  Neck: Normal range of motion. Neck supple. No thyromegaly present.  Cardiovascular: Normal rate, regular rhythm and normal heart sounds.   No murmur heard. Pulmonary/Chest: No respiratory distress. He has no wheezes. He has no rales.  Abdominal: Soft. Bowel sounds are normal. He exhibits no distension and no mass. There is no tenderness. There is no rebound and no guarding.  Musculoskeletal: He exhibits no edema.  Lymphadenopathy:    He has no cervical adenopathy.  Neurological: He is alert and oriented to person, place, and time. He displays normal reflexes. No cranial nerve deficit.  Skin: No rash noted.  No concerning skin lesions  Psychiatric: He has a normal mood and affect.          Assessment & Plan:  Complete physical. Labs reviewed with patient. He is seeing improvement in terms of lipids and also blood sugar which is now the normal range. Establish more consistent exercise. Continue weight control efforts. Reassess one year

## 2014-05-04 NOTE — Progress Notes (Signed)
Pre visit review using our clinic review tool, if applicable. No additional management support is needed unless otherwise documented below in the visit note. 

## 2014-05-04 NOTE — Patient Instructions (Signed)
Hypertriglyceridemia  Diet for High blood levels of Triglycerides Most fats in food are triglycerides. Triglycerides in your blood are stored as fat in your body. High levels of triglycerides in your blood may put you at a greater risk for heart disease and stroke.  Normal triglyceride levels are less than 150 mg/dL. Borderline high levels are 150-199 mg/dl. High levels are 200 - 499 mg/dL, and very high triglyceride levels are greater than 500 mg/dL. The decision to treat high triglycerides is generally based on the level. For people with borderline or high triglyceride levels, treatment includes weight loss and exercise. Drugs are recommended for people with very high triglyceride levels. Many people who need treatment for high triglyceride levels have metabolic syndrome. This syndrome is a collection of disorders that often include: insulin resistance, high blood pressure, blood clotting problems, high cholesterol and triglycerides. TESTING PROCEDURE FOR TRIGLYCERIDES  You should not eat 4 hours before getting your triglycerides measured. The normal range of triglycerides is between 10 and 250 milligrams per deciliter (mg/dl). Some people may have extreme levels (1000 or above), but your triglyceride level may be too high if it is above 150 mg/dl, depending on what other risk factors you have for heart disease.  People with high blood triglycerides may also have high blood cholesterol levels. If you have high blood cholesterol as well as high blood triglycerides, your risk for heart disease is probably greater than if you only had high triglycerides. High blood cholesterol is one of the main risk factors for heart disease. CHANGING YOUR DIET  Your weight can affect your blood triglyceride level. If you are more than 20% above your ideal body weight, you may be able to lower your blood triglycerides by losing weight. Eating less and exercising regularly is the best way to combat this. Fat provides more  calories than any other food. The best way to lose weight is to eat less fat. Only 30% of your total calories should come from fat. Less than 7% of your diet should come from saturated fat. A diet low in fat and saturated fat is the same as a diet to decrease blood cholesterol. By eating a diet lower in fat, you may lose weight, lower your blood cholesterol, and lower your blood triglyceride level.  Eating a diet low in fat, especially saturated fat, may also help you lower your blood triglyceride level. Ask your dietitian to help you figure how much fat you can eat based on the number of calories your caregiver has prescribed for you.  Exercise, in addition to helping with weight loss may also help lower triglyceride levels.   Alcohol can increase blood triglycerides. You may need to stop drinking alcoholic beverages.  Too much carbohydrate in your diet may also increase your blood triglycerides. Some complex carbohydrates are necessary in your diet. These may include bread, rice, potatoes, other starchy vegetables and cereals.  Reduce "simple" carbohydrates. These may include pure sugars, candy, honey, and jelly without losing other nutrients. If you have the kind of high blood triglycerides that is affected by the amount of carbohydrates in your diet, you will need to eat less sugar and less high-sugar foods. Your caregiver can help you with this.  Adding 2-4 grams of fish oil (EPA+ DHA) may also help lower triglycerides. Speak with your caregiver before adding any supplements to your regimen. Following the Diet  Maintain your ideal weight. Your caregivers can help you with a diet. Generally, eating less food and getting more   exercise will help you lose weight. Joining a weight control group may also help. Ask your caregivers for a good weight control group in your area.  Eat low-fat foods instead of high-fat foods. This can help you lose weight too.  These foods are lower in fat. Eat MORE of these:    Dried beans, peas, and lentils.  Egg whites.  Low-fat cottage cheese.  Fish.  Lean cuts of meat, such as round, sirloin, rump, and flank (cut extra fat off meat you fix).  Whole grain breads, cereals and pasta.  Skim and nonfat dry milk.  Low-fat yogurt.  Poultry without the skin.  Cheese made with skim or part-skim milk, such as mozzarella, parmesan, farmers', ricotta, or pot cheese. These are higher fat foods. Eat LESS of these:   Whole milk and foods made from whole milk, such as American, blue, cheddar, monterey jack, and swiss cheese  High-fat meats, such as luncheon meats, sausages, knockwurst, bratwurst, hot dogs, ribs, corned beef, ground pork, and regular ground beef.  Fried foods. Limit saturated fats in your diet. Substituting unsaturated fat for saturated fat may decrease your blood triglyceride level. You will need to read package labels to know which products contain saturated fats.  These foods are high in saturated fat. Eat LESS of these:   Fried pork skins.  Whole milk.  Skin and fat from poultry.  Palm oil.  Butter.  Shortening.  Cream cheese.  Bacon.  Margarines and baked goods made from listed oils.  Vegetable shortenings.  Chitterlings.  Fat from meats.  Coconut oil.  Palm kernel oil.  Lard.  Cream.  Sour cream.  Fatback.  Coffee whiteners and non-dairy creamers made with these oils.  Cheese made from whole milk. Use unsaturated fats (both polyunsaturated and monounsaturated) moderately. Remember, even though unsaturated fats are better than saturated fats; you still want a diet low in total fat.  These foods are high in unsaturated fat:   Canola oil.  Sunflower oil.  Mayonnaise.  Almonds.  Peanuts.  Pine nuts.  Margarines made with these oils.  Safflower oil.  Olive oil.  Avocados.  Cashews.  Peanut butter.  Sunflower seeds.  Soybean oil.  Peanut  oil.  Olives.  Pecans.  Walnuts.  Pumpkin seeds. Avoid sugar and other high-sugar foods. This will decrease carbohydrates without decreasing other nutrients. Sugar in your food goes rapidly to your blood. When there is excess sugar in your blood, your liver may use it to make more triglycerides. Sugar also contains calories without other important nutrients.  Eat LESS of these:   Sugar, brown sugar, powdered sugar, jam, jelly, preserves, honey, syrup, molasses, pies, candy, cakes, cookies, frosting, pastries, colas, soft drinks, punches, fruit drinks, and regular gelatin.  Avoid alcohol. Alcohol, even more than sugar, may increase blood triglycerides. In addition, alcohol is high in calories and low in nutrients. Ask for sparkling water, or a diet soft drink instead of an alcoholic beverage. Suggestions for planning and preparing meals   Bake, broil, grill or roast meats instead of frying.  Remove fat from meats and skin from poultry before cooking.  Add spices, herbs, lemon juice or vinegar to vegetables instead of salt, rich sauces or gravies.  Use a non-stick skillet without fat or use no-stick sprays.  Cool and refrigerate stews and broth. Then remove the hardened fat floating on the surface before serving.  Refrigerate meat drippings and skim off fat to make low-fat gravies.  Serve more fish.  Use less butter,   margarine and other high-fat spreads on bread or vegetables.  Use skim or reconstituted non-fat dry milk for cooking.  Cook with low-fat cheeses.  Substitute low-fat yogurt or cottage cheese for all or part of the sour cream in recipes for sauces, dips or congealed salads.  Use half yogurt/half mayonnaise in salad recipes.  Substitute evaporated skim milk for cream. Evaporated skim milk or reconstituted non-fat dry milk can be whipped and substituted for whipped cream in certain recipes.  Choose fresh fruits for dessert instead of high-fat foods such as pies or  cakes. Fruits are naturally low in fat. When Dining Out   Order low-fat appetizers such as fruit or vegetable juice, pasta with vegetables or tomato sauce.  Select clear, rather than cream soups.  Ask that dressings and gravies be served on the side. Then use less of them.  Order foods that are baked, broiled, poached, steamed, stir-fried, or roasted.  Ask for margarine instead of butter, and use only a small amount.  Drink sparkling water, unsweetened tea or coffee, or diet soft drinks instead of alcohol or other sweet beverages. QUESTIONS AND ANSWERS ABOUT OTHER FATS IN THE BLOOD: SATURATED FAT, TRANS FAT, AND CHOLESTEROL What is trans fat? Trans fat is a type of fat that is formed when vegetable oil is hardened through a process called hydrogenation. This process helps makes foods more solid, gives them shape, and prolongs their shelf life. Trans fats are also called hydrogenated or partially hydrogenated oils.  What do saturated fat, trans fat, and cholesterol in foods have to do with heart disease? Saturated fat, trans fat, and cholesterol in the diet all raise the level of LDL "bad" cholesterol in the blood. The higher the LDL cholesterol, the greater the risk for coronary heart disease (CHD). Saturated fat and trans fat raise LDL similarly.  What foods contain saturated fat, trans fat, and cholesterol? High amounts of saturated fat are found in animal products, such as fatty cuts of meat, chicken skin, and full-fat dairy products like butter, whole milk, cream, and cheese, and in tropical vegetable oils such as palm, palm kernel, and coconut oil. Trans fat is found in some of the same foods as saturated fat, such as vegetable shortening, some margarines (especially hard or stick margarine), crackers, cookies, baked goods, fried foods, salad dressings, and other processed foods made with partially hydrogenated vegetable oils. Small amounts of trans fat also occur naturally in some animal  products, such as milk products, beef, and lamb. Foods high in cholesterol include liver, other organ meats, egg yolks, shrimp, and full-fat dairy products. How can I use the new food label to make heart-healthy food choices? Check the Nutrition Facts panel of the food label. Choose foods lower in saturated fat, trans fat, and cholesterol. For saturated fat and cholesterol, you can also use the Percent Daily Value (%DV): 5% DV or less is low, and 20% DV or more is high. (There is no %DV for trans fat.) Use the Nutrition Facts panel to choose foods low in saturated fat and cholesterol, and if the trans fat is not listed, read the ingredients and limit products that list shortening or hydrogenated or partially hydrogenated vegetable oil, which tend to be high in trans fat. POINTS TO REMEMBER:   Discuss your risk for heart disease with your caregivers, and take steps to reduce risk factors.  Change your diet. Choose foods that are low in saturated fat, trans fat, and cholesterol.  Add exercise to your daily routine if   it is not already being done. Participate in physical activity of moderate intensity, like brisk walking, for at least 30 minutes on most, and preferably all days of the week. No time? Break the 30 minutes into three, 10-minute segments during the day.  Stop smoking. If you do smoke, contact your caregiver to discuss ways in which they can help you quit.  Do not use street drugs.  Maintain a normal weight.  Maintain a healthy blood pressure.  Keep up with your blood work for checking the fats in your blood as directed by your caregiver. Document Released: 09/21/2004 Document Revised: 06/04/2012 Document Reviewed: 04/19/2009 [facility] Patient Information 2014 [place]. Fat and Cholesterol Control Diet Fat and cholesterol levels in your blood and organs are influenced by your diet. High levels of fat and cholesterol may lead to diseases of the heart, small and large blood  vessels, gallbladder, liver, and pancreas. CONTROLLING FAT AND CHOLESTEROL WITH DIET Although exercise and lifestyle factors are important, your diet is key. That is because certain foods are known to raise cholesterol and others to lower it. The goal is to balance foods for their effect on cholesterol and more importantly, to replace saturated and trans fat with other types of fat, such as monounsaturated fat, polyunsaturated fat, and omega-3 fatty acids. On average, a person should consume no more than 15 to 17 g of saturated fat daily. Saturated and trans fats are considered "bad" fats, and they will raise LDL cholesterol. Saturated fats are primarily found in animal products such as meats, butter, and cream. However, that does not mean you need to give up all your favorite foods. Today, there are good tasting, low-fat, low-cholesterol substitutes for most of the things you like to eat. Choose low-fat or nonfat alternatives. Choose round or loin cuts of red meat. These types of cuts are lowest in fat and cholesterol. Chicken (without the skin), fish, veal, and ground [place] breast are great choices. Eliminate fatty meats, such as hot dogs and salami. Even shellfish have little or no saturated fat. Have a 3 oz (85 g) portion when you eat lean meat, poultry, or fish. Trans fats are also called "partially hydrogenated oils." They are oils that have been scientifically manipulated so that they are solid at room temperature resulting in a longer shelf life and improved taste and texture of foods in which they are added. Trans fats are found in stick margarine, some tub margarines, cookies, crackers, and baked goods.  When baking and cooking, oils are a great substitute for butter. The monounsaturated oils are especially beneficial since it is believed they lower LDL and raise HDL. The oils you should avoid entirely are saturated tropical oils, such as coconut and palm.  Remember to eat a lot from food groups  that are naturally free of saturated and trans fat, including fish, fruit, vegetables, beans, grains (barley, rice, couscous, bulgur wheat), and pasta (without cream sauces).  IDENTIFYING FOODS THAT LOWER FAT AND CHOLESTEROL  Soluble fiber may lower your cholesterol. This type of fiber is found in fruits such as apples, vegetables such as broccoli, potatoes, and carrots, legumes such as beans, peas, and lentils, and grains such as barley. Foods fortified with plant sterols (phytosterol) may also lower cholesterol. You should eat at least 2 g per day of these foods for a cholesterol lowering effect.  Read package labels to identify low-saturated fats, trans fat free, and low-fat foods at the supermarket. Select cheeses that have only 2 to 3 g  saturated fat per ounce. Use a heart-healthy tub margarine that is free of trans fats or partially hydrogenated oil. When buying baked goods (cookies, crackers), avoid partially hydrogenated oils. Breads and muffins should be made from whole grains (whole-wheat or whole oat flour, instead of "flour" or "enriched flour"). Buy non-creamy canned soups with reduced salt and no added fats.  FOOD PREPARATION TECHNIQUES  Never deep-fry. If you must fry, either stir-fry, which uses very little fat, or use non-stick cooking sprays. When possible, broil, bake, or roast meats, and steam vegetables. Instead of putting butter or margarine on vegetables, use lemon and herbs, applesauce, and cinnamon (for squash and sweet potatoes). Use nonfat yogurt, salsa, and low-fat dressings for salads.  LOW-SATURATED FAT / LOW-FAT FOOD SUBSTITUTES Meats / Saturated Fat (g)  Avoid: Steak, marbled (3 oz/85 g) / 11 g  Choose: Steak, lean (3 oz/85 g) / 4 g  Avoid: Hamburger (3 oz/85 g) / 7 g  Choose: Hamburger, lean (3 oz/85 g) / 5 g  Avoid: Ham (3 oz/85 g) / 6 g  Choose: Ham, lean cut (3 oz/85 g) / 2.4 g  Avoid: Chicken, with skin, dark meat (3 oz/85 g) / 4 g  Choose: Chicken, skin  removed, dark meat (3 oz/85 g) / 2 g  Avoid: Chicken, with skin, light meat (3 oz/85 g) / 2.5 g  Choose: Chicken, skin removed, light meat (3 oz/85 g) / 1 g Dairy / Saturated Fat (g)  Avoid: Whole milk (1 cup) / 5 g  Choose: Low-fat milk, 2% (1 cup) / 3 g  Choose: Low-fat milk, 1% (1 cup) / 1.5 g  Choose: Skim milk (1 cup) / 0.3 g  Avoid: Hard cheese (1 oz/28 g) / 6 g  Choose: Skim milk cheese (1 oz/28 g) / 2 to 3 g  Avoid: Cottage cheese, 4% fat (1 cup) / 6.5 g  Choose: Low-fat cottage cheese, 1% fat (1 cup) / 1.5 g  Avoid: Ice cream (1 cup) / 9 g  Choose: Sherbet (1 cup) / 2.5 g  Choose: Nonfat frozen yogurt (1 cup) / 0.3 g  Choose: Frozen fruit bar / trace  Avoid: Whipped cream (1 tbs) / 3.5 g  Choose: Nondairy whipped topping (1 tbs) / 1 g Condiments / Saturated Fat (g)  Avoid: Mayonnaise (1 tbs) / 2 g  Choose: Low-fat mayonnaise (1 tbs) / 1 g  Avoid: Butter (1 tbs) / 7 g  Choose: Extra light margarine (1 tbs) / 1 g  Avoid: Coconut oil (1 tbs) / 11.8 g  Choose: Olive oil (1 tbs) / 1.8 g  Choose: Corn oil (1 tbs) / 1.7 g  Choose: Safflower oil (1 tbs) / 1.2 g  Choose: Sunflower oil (1 tbs) / 1.4 g  Choose: Soybean oil (1 tbs) / 2.4 g  Choose: Canola oil (1 tbs) / 1 g Document Released: 12/04/2005 Document Revised: 03/31/2013 Document Reviewed: 05/25/2011 [employer] Patient Information 2014 [place], [place].

## 2015-01-23 ENCOUNTER — Emergency Department: Payer: Self-pay | Admitting: Emergency Medicine

## 2015-03-02 ENCOUNTER — Encounter: Payer: Self-pay | Admitting: Family Medicine

## 2015-03-02 ENCOUNTER — Ambulatory Visit (INDEPENDENT_AMBULATORY_CARE_PROVIDER_SITE_OTHER): Payer: PRIVATE HEALTH INSURANCE | Admitting: Family Medicine

## 2015-03-02 VITALS — BP 130/82 | HR 92 | Temp 98.4°F | Wt 287.0 lb

## 2015-03-02 DIAGNOSIS — M25461 Effusion, right knee: Secondary | ICD-10-CM

## 2015-03-02 MED ORDER — MELOXICAM 15 MG PO TABS: 15.0000 mg | ORAL_TABLET | Freq: Every day | ORAL | Status: DC

## 2015-03-02 NOTE — Progress Notes (Signed)
   Subjective:    Patient ID: Shane Brewer, male    DOB: 21-Shane-1977, 39 y.o.   MRN: [id number]  HPI Patient seen with right knee pain. Onset about 3 days ago. He's noticed small effusion. No warmth or erythema. Poorly localized pain. Somewhat posterior knee and both medial and lateral. No recent injury. He has pain which is worse with standing and walking. He's tried naproxen with minimal improvement. No weakness. No locking or giving way. He's had no prior history of right knee difficulties. Remote history of meniscus problem left knee.  No hx of gout or pseudogout.  Past Medical History  Diagnosis Date  . Arthritis   . GERD (gastroesophageal reflux disease)   . Migraine    Past Surgical History  Procedure Laterality Date  . Wrist surgery  2000    right wrist injury    reports that he has never smoked. He does not have any smokeless tobacco history on file. He reports that he drinks alcohol. He reports that he does not use illicit drugs. family history includes Alcohol abuse in his maternal uncle and paternal grandfather; Arthritis in his father and mother; Cancer in his paternal grandmother; Diabetes in his maternal grandmother; Heart disease in his maternal grandmother and paternal grandfather; Hypertension in his father, maternal grandmother, and paternal grandfather; Stroke in his maternal grandmother. Allergies  Allergen Reactions  . Strawberry       Review of Systems  Constitutional: Negative for fever and chills.  Musculoskeletal: Negative for gait problem.  Skin: Negative for rash.  Neurological: Negative for weakness.       Objective:   Physical Exam  Constitutional: He appears well-developed and well-nourished.  Cardiovascular: Normal rate and regular rhythm.   Musculoskeletal:  Right knee reveals full range of motion. Small effusion. No warmth or erythema. No ecchymosis. Mild medial and lateral joint line tenderness. Cruciate ligament testing is normal. No  popliteal edema.  Skin: No rash noted.          Assessment & Plan:  Right knee pain. Small effusion. Question meniscal tear. Recommend ice, elevation, elastic knee sleeve, meloxicam 15 mg once daily. If no improvement in 3-4 weeks consider MRI to further assess

## 2015-03-02 NOTE — Patient Instructions (Signed)
Consider elastic knee support Ice 2-3 times daily and especially after activities. Take anti-inflammatory once daily Call in  3-4 weeks if no better.

## 2015-03-02 NOTE — Progress Notes (Signed)
Pre visit review using our clinic review tool, if applicable. No additional management support is needed unless otherwise documented below in the visit note. 

## 2015-03-15 ENCOUNTER — Telehealth: Payer: Self-pay | Admitting: Family Medicine

## 2015-03-15 NOTE — Telephone Encounter (Signed)
i recommend consider follow up with Dr Gardenia Phlegm to further evaluate.

## 2015-03-15 NOTE — Telephone Encounter (Signed)
Patient states Dr. Elease Hashimoto told him to callback if his knee is still hurting.  Patient states he is still having the same pain.

## 2015-03-16 ENCOUNTER — Other Ambulatory Visit: Payer: Self-pay | Admitting: Family Medicine

## 2015-03-16 DIAGNOSIS — M25561 Pain in right knee: Secondary | ICD-10-CM

## 2015-03-16 NOTE — Telephone Encounter (Signed)
Pt is aware. Referral is ordered.

## 2015-03-19 ENCOUNTER — Ambulatory Visit (INDEPENDENT_AMBULATORY_CARE_PROVIDER_SITE_OTHER)
Admission: RE | Admit: 2015-03-19 | Discharge: 2015-03-19 | Disposition: A | Discharge status: Home / Self Care | Payer: PRIVATE HEALTH INSURANCE | Source: Ambulatory Visit | Attending: Family Medicine | Admitting: Family Medicine

## 2015-03-19 ENCOUNTER — Encounter: Payer: Self-pay | Admitting: Family Medicine

## 2015-03-19 ENCOUNTER — Ambulatory Visit (INDEPENDENT_AMBULATORY_CARE_PROVIDER_SITE_OTHER): Payer: PRIVATE HEALTH INSURANCE | Admitting: Family Medicine

## 2015-03-19 ENCOUNTER — Other Ambulatory Visit (INDEPENDENT_AMBULATORY_CARE_PROVIDER_SITE_OTHER): Payer: PRIVATE HEALTH INSURANCE

## 2015-03-19 VITALS — BP 116/80 | HR 98 | Ht 70.0 in | Wt 292.0 lb

## 2015-03-19 DIAGNOSIS — M25561 Pain in right knee: Secondary | ICD-10-CM

## 2015-03-19 DIAGNOSIS — S83241A Other tear of medial meniscus, current injury, right knee, initial encounter: Secondary | ICD-10-CM

## 2015-03-19 DIAGNOSIS — S83249A Other tear of medial meniscus, current injury, unspecified knee, initial encounter: Secondary | ICD-10-CM | POA: Insufficient documentation

## 2015-03-19 NOTE — Progress Notes (Signed)
[facility] [place],  [address] Phone: [phone] Subjective:    I'm seeing this patient by the request  of:  Eulas Post, MD   CC: Right knee pain  [name] Shane Brewer is a 39 y.o. male coming in with complaint of patient states 3-4 weeks ago patient was going up stairs and felt some discomfort in the right knee. Patient states since then it seems to be getting worse. Patient was seen by primary care provider 2 weeks ago and did have an effusion at that time. Patient states that going up and down stairs can be severely painful mostly over the medial joint line. Patient also states that sometimes he has a popping sensation that is very painful. Denies any locking but seems to have almost given out on him on a couple of occasions. Patient puts the severity of pain is 3 out of 10 at all times but then 10 out of 10 when it does give a popping sensation. Patient denies any nighttime awakening.     Past medical history, social, surgical and family history all reviewed in electronic medical record.  Past Medical History  Diagnosis Date  . Arthritis   . GERD (gastroesophageal reflux disease)   . Migraine    Past Surgical History  Procedure Laterality Date  . Wrist surgery  2000    right wrist injury   History   Social History  . Marital Status: Single    Spouse Name: N/A  . Number of Children: N/A  . Years of Education: N/A   Occupational History  . Not on file.   Social History Main Topics  . Smoking status: Never Smoker   . Smokeless tobacco: Not on file  . Alcohol Use: Yes     Comment: occasionally  . Drug Use: No  . Sexual Activity: Not on file   Other Topics Concern  . Not on file   Social History Narrative   Family History  Problem Relation Age of Onset  . Arthritis Mother   . Arthritis Father   . Hypertension Father   . Alcohol abuse Maternal Uncle   . Heart disease Maternal Grandmother   .  Hypertension Maternal Grandmother   . Stroke Maternal Grandmother   . Diabetes Maternal Grandmother   . Heart disease Paternal Grandfather   . Hypertension Paternal Grandfather   . Alcohol abuse Paternal Grandfather   . Cancer Paternal Grandmother     gastric cancer   Allergies  Allergen Reactions  . Strawberry      Review of Systems: No headache, visual changes, nausea, vomiting, diarrhea, constipation, dizziness, abdominal pain, skin rash, fevers, chills, night sweats, weight loss, swollen lymph nodes, body aches, joint swelling, muscle aches, chest pain, shortness of breath, mood changes.   Objective Blood pressure 116/80, pulse 98, height 5\' 10"  (1.778 m), weight 292 lb (132.45 kg), SpO2 95 %.  General: No apparent distress alert and oriented x3 mood and affect normal, dressed appropriately.  HEENT: Pupils equal, extraocular movements intact  Respiratory: Patient's speak in full sentences and does not appear short of breath  Cardiovascular: No lower extremity edema, non tender, no erythema  Skin: Warm dry intact with no signs of infection or rash on extremities or on axial skeleton.  Abdomen: Soft nontender  Neuro: Cranial nerves II through XII are intact, neurovascularly intact in all extremities with 2+ DTRs and 2+ pulses.  Lymph: No lymphadenopathy of posterior or anterior cervical chain or axillae  bilaterally.  Gait normal with good balance and coordination.  MSK:  Non tender with full range of motion and good stability and symmetric strength and tone of shoulders, elbows, wrist, hip, and ankles bilaterally.  Knee: Right Normal to inspection with no erythema or effusion or obvious bony abnormalities. Tender to palpation over the medial joint line ROM full in flexion and extension and lower leg rotation. Ligaments with solid consistent endpoints including ACL, PCL, LCL, MCL. Positive Mcmurray's, Apley's, and Thessalonian tests. Non painful patellar compression. Patellar  glide with minimal crepitus. Patellar and quadriceps tendons unremarkable. Hamstring and quadriceps strength is normal.  Contralateral knee unremarkable  MSK US performed of: Right knee This study was ordered, performed, and interpreted by Charlann Boxer D.O.  Knee: All structures visualized. Patient does have a fairly large anterior medial meniscus that has minimal displacement tear. Patellar Tendon unremarkable on long and transverse views without effusion. No abnormality of prepatellar bursa. LCLunremarkable on long and transverse views. Mild hypoechoic changes of the MCL. No abnormality of origin of medial or lateral head of the gastrocnemius.  IMPRESSION:  Anterior medial meniscal tear  Procedure: Real-time Ultrasound Guided Injection of right knee Device: GE Logiq E  Ultrasound guided injection is preferred based studies that show increased duration, increased effect, greater accuracy, decreased procedural pain, increased response rate, and decreased cost with ultrasound guided versus blind injection.  Verbal informed consent obtained.  Time-out conducted.  Noted no overlying erythema, induration, or other signs of local infection.  Skin prepped in a sterile fashion.  Local anesthesia: Topical Ethyl chloride.  With sterile technique and under real time ultrasound guidance: With a 22-gauge 2 inch needle patient was injected with 4 cc of 0.5% Marcaine and 1 cc of Kenalog 40 mg/dL. This was from a superior lateral approach.  Completed without difficulty  Pain immediately resolved suggesting accurate placement of the medication.  Advised to call if fevers/chills, erythema, induration, drainage, or persistent bleeding.  Images permanently stored and available for review in the ultrasound unit.  Impression: Technically successful ultrasound guided injection.  Procedure note 58850; 15 minutes spent for Therapeutic exercises as stated in above notes.  This included exercises focusing on  stretching, strengthening, with significant focus on eccentric aspects.  We discuss importance of vastus medialis oblique strengthening as well as avoiding any twisting motions. We also discussed stabilization with hip abductor's as well as ankles stability. Discussed #, number times a week as well as repetitions. Proper technique shown and discussed handout in great detail with ATC.  All questions were discussed and answered.     Impression and Recommendations:     This case required medical decision making of moderate complexity.

## 2015-03-19 NOTE — Progress Notes (Signed)
Pre visit review using our clinic review tool, if applicable. No additional management support is needed unless otherwise documented below in the visit note. 

## 2015-03-19 NOTE — Patient Instructions (Signed)
Good to see you.  Ice 20 minutes 2 times daily. Usually after activity and before bed. Exercises 3 times a week.  Try pennsaid twice daily as needed Vitamin D 2000 IU daily Turmeric 500mg  twice daily See me again in 3 weeks

## 2015-03-19 NOTE — Assessment & Plan Note (Signed)
Patient was given an injection today. We discussed icing regimen and home exercises. We discussed the possibility that if this displacement does not improve patient continues to have some locking sensations are patient does start having the knee give out on him further imaging is necessary. Patient's work with Product/process development scientist today to learn home exercises in greater detail. Patient will make these changes and come back and see me again in 3 weeks. We may need advanced imaging if it did not make significant improvement. X-rays ordered today as well.

## 2015-04-09 ENCOUNTER — Ambulatory Visit (INDEPENDENT_AMBULATORY_CARE_PROVIDER_SITE_OTHER): Payer: PRIVATE HEALTH INSURANCE | Admitting: Family Medicine

## 2015-04-09 ENCOUNTER — Encounter: Payer: Self-pay | Admitting: Family Medicine

## 2015-04-09 VITALS — BP 120/82 | HR 88 | Ht 70.0 in | Wt 287.0 lb

## 2015-04-09 DIAGNOSIS — M25561 Pain in right knee: Secondary | ICD-10-CM | POA: Diagnosis not present

## 2015-04-09 DIAGNOSIS — S83241D Other tear of medial meniscus, current injury, right knee, subsequent encounter: Secondary | ICD-10-CM

## 2015-04-09 MED ORDER — IBUPROFEN-FAMOTIDINE 800-26.6 MG PO TABS: ORAL_TABLET | ORAL | Status: DC

## 2015-04-09 NOTE — Progress Notes (Signed)
Pre visit review using our clinic review tool, if applicable. No additional management support is needed unless otherwise documented below in the visit note. 

## 2015-04-09 NOTE — Patient Instructions (Signed)
Good to see you Ice is your friend Try the duexis up to 3 times daily We will get mri and I want to see you 1-2 days afterward to discuss.  Depending on findings we will discuss surgery vs physical therapy.

## 2015-04-09 NOTE — Progress Notes (Signed)
[facility] [place], [place] [address] Phone: [phone] Subjective:   CC: Right knee pain follow-up  [id number] Shane Brewer is a 39 y.o. male coming in with complaint of right knee pain. Patient was seen in 3-4 weeks ago and was diagnosed with a acute medial meniscal tear. Patient was given an injection and conservative therapy including home exercises, icing protocol and we discussed over-the-counter natural supplementations. Patient states unfortunately the injection helped for approximately 1 week and the pain seemed to worsen. Patient states that unfortunately now he is having clicking, and even locking of the knee. Patient has not fallen but feels that there is some instability of the knee itself. Patient has had difficulty doing even daily activities and standing at work can be very uncomfortable. States that the pain can keep him up at night sometimes. No radiation and seems to be localized over the medial aspect of the knee.    Past medical history, social, surgical and family history all reviewed in electronic medical record.  Past Medical History  Diagnosis Date  . Arthritis   . GERD (gastroesophageal reflux disease)   . Migraine    Past Surgical History  Procedure Laterality Date  . Wrist surgery  2000    right wrist injury   History   Social History  . Marital Status: Single    Spouse Name: N/A  . Number of Children: N/A  . Years of Education: N/A   Occupational History  . Not on file.   Social History Main Topics  . Smoking status: Never Smoker   . Smokeless tobacco: Not on file  . Alcohol Use: Yes     Comment: occasionally  . Drug Use: No  . Sexual Activity: Not on file   Other Topics Concern  . Not on file   Social History Narrative   Family History  Problem Relation Age of Onset  . Arthritis Mother   . Arthritis Father   . Hypertension Father   . Alcohol abuse Maternal Uncle   . Heart disease  Maternal Grandmother   . Hypertension Maternal Grandmother   . Stroke Maternal Grandmother   . Diabetes Maternal Grandmother   . Heart disease Paternal Grandfather   . Hypertension Paternal Grandfather   . Alcohol abuse Paternal Grandfather   . Cancer Paternal Grandmother     gastric cancer   Allergies  Allergen Reactions  . Strawberry      Review of Systems: No headache, visual changes, nausea, vomiting, diarrhea, constipation, dizziness, abdominal pain, skin rash, fevers, chills, night sweats, weight loss, swollen lymph nodes, body aches, joint swelling, muscle aches, chest pain, shortness of breath, mood changes.   Objective Blood pressure 120/82, pulse 88, height 5\' 10"  (1.778 m), weight 287 lb (130.182 kg), SpO2 98 %.  General: No apparent distress alert and oriented x3 mood and affect normal, dressed appropriately.  HEENT: Pupils equal, extraocular movements intact  Respiratory: Patient's speak in full sentences and does not appear short of breath  Cardiovascular: No lower extremity edema, non tender, no erythema  Skin: Warm dry intact with no signs of infection or rash on extremities or on axial skeleton.  Abdomen: Soft nontender  Neuro: Cranial nerves II through XII are intact, neurovascularly intact in all extremities with 2+ DTRs and 2+ pulses.  Lymph: No lymphadenopathy of posterior or anterior cervical chain or axillae bilaterally.  Gait normal with good balance and coordination.  MSK:  Non tender with full range of  motion and good stability and symmetric strength and tone of shoulders, elbows, wrist, hip, and ankles bilaterally.  Knee: Right Normal to inspection with no erythema or effusion or obvious bony abnormalities. Tender to palpation over the medial joint line ROM full in flexion and extension and lower leg rotation. Ligaments with solid consistent endpoints including ACL, PCL, LCL, MCL. Positive Mcmurray's, Apley's, and Thessalonian tests. Non painful patellar  compression. Patellar glide with minimal crepitus. Patellar and quadriceps tendons unremarkable. Hamstring and quadriceps strength is normal.  Contralateral knee unremarkable  MSK US performed of: Right knee This study was ordered, performed, and interpreted by Charlann Boxer D.O.  Knee: All structures visualized. Patient does have a fairly large anterior medial meniscus that has moderate displacement which is seems to be worse than previous exam. Patellar Tendon unremarkable on long and transverse views without effusion. No abnormality of prepatellar bursa. LCLunremarkable on long and transverse views. Mild hypoechoic changes of the MCL. No abnormality of origin of medial or lateral head of the gastrocnemius.  IMPRESSION:  Anterior medial meniscal tear continues with mild displacement     Impression and Recommendations:     This case required medical decision making of moderate complexity.

## 2015-04-09 NOTE — Assessment & Plan Note (Signed)
Patient has not responded to conservative therapy and is now having instability of the knee with locking. Discussed with patient about this time to try to do bracing on a more regular basis. Patient has pain medications but I do not want to give him any chronic narcotics. Patient will be set up for an MRI secondary to this instability of the knee to further evaluate the meniscal tear. There is a good chance that if there is a tear that is deep intra-articularly patient may need surgical intervention. Discussed with patient he would be inclined to do so. Patient come back 1-2 days after the MRI and depending on findings we'll either consider surgical intervention versus possible formal physical therapy.  Spent  25 minutes with patient face-to-face and had greater than 50% of counseling including as described above in assessment and plan.

## 2015-04-14 ENCOUNTER — Other Ambulatory Visit: Payer: Self-pay | Admitting: *Deleted

## 2015-04-14 DIAGNOSIS — M25561 Pain in right knee: Secondary | ICD-10-CM

## 2015-04-15 ENCOUNTER — Ambulatory Visit: Payer: PRIVATE HEALTH INSURANCE | Attending: Family Medicine | Admitting: Physical Therapy

## 2015-04-15 ENCOUNTER — Encounter: Payer: Self-pay | Admitting: Physical Therapy

## 2015-04-15 DIAGNOSIS — M25561 Pain in right knee: Secondary | ICD-10-CM

## 2015-04-15 NOTE — Therapy (Signed)
[place], [place], [address] Phone: [phone]   Fax:  [phone]  Physical Therapy Evaluation  Patient Details  Name: Shane Brewer MRN: [id number] Date of Birth: 03-02-1976 Referring Provider:  Lyndal Pulley, DO  Encounter Date: 04/15/2015      PT End of Session - 04/15/15 1306    Visit Number 1   Number of Visits 16   Date for PT Re-Evaluation 06/10/15   PT Start Time 0800   PT Stop Time 0844   PT Time Calculation (min) 44 min   Equipment Utilized During Treatment --  strap   Activity Tolerance Patient tolerated treatment well   Behavior During Therapy [facility] for tasks assessed/performed      Past Medical History  Diagnosis Date  . Arthritis   . GERD (gastroesophageal reflux disease)   . Migraine     Past Surgical History  Procedure Laterality Date  . Wrist surgery  2000    right wrist injury    There were no vitals filed for this visit.  Visit Diagnosis:  Right knee pain      Subjective Assessment - 04/15/15 0806    Subjective I woke up and my right knee was hurting,  Pt does not report any incident for knee pain.     Limitations Walking   How long can you sit comfortably? unlimited   How long can you stand comfortably? 30 minutes   How long can you walk comfortably? 30 minutes   Diagnostic tests MRI    Patient Stated Goals I would like to be painfree .    Currently in Pain? Yes   Pain Score 8    Pain Location Knee   Pain Orientation Right   Pain Descriptors / Indicators Aching;Sharp   Pain Type Chronic pain   Pain Onset 1 to 4 weeks ago   Pain Frequency Constant   Aggravating Factors  standing, walking, job duties working at [employer]   Pain Relieving Factors Taking Ibuprofen            [facility] PT Assessment - 04/15/15 0810    Assessment   Medical Diagnosis Right knee pain, R meniscal tear   Onset Date 03/15/15   Prior Therapy none   Precautions   Precautions None    Restrictions   Weight Bearing Restrictions No   Balance Screen   Has the patient fallen in the past 6 months No   Has the patient had a decrease in activity level because of a fear of falling?  No   Is the patient reluctant to leave their home because of a fear of falling?  No   Home Environment   Living Enviornment Private residence   Living Arrangements Other relatives   Type of [place] to enter   Entrance Stairs-Number of Steps 2   Entrance Stairs-Rails Right   Home Layout Two level   Prior Function   Level of Independence Independent with basic ADLs;Independent with homemaking with ambulation;Independent with homemaking with wheelchair;Independent with gait;Independent with transfers   Cognition   Overall Cognitive Status Within Functional Limits for tasks assessed   Observation/Other Assessments   Observations Pt obese   Focus on Therapeutic Outcomes (FOTO)  Intake 41%,limitation 59%, predicted 33%   Squat   Comments Wt bear to left semi squat with pain in Righ   Step Down   Comments fair motor control wiht Right LE wiht Left descending   Posture/Postural  Control   Posture/Postural Control Postural limitations   Postural Limitations Rounded Shoulders;Increased lumbar lordosis;Anterior pelvic tilt   Posture Comments increased abdominal girth,  stands with Rght foot ER more than Right   AROM   Right Knee Extension 5   Right Knee Flexion 118   Left Knee Extension 3   Left Knee Flexion 130   Strength   Overall Strength Within functional limits for tasks performed  Grossly 4+/5   Right Hip Flexion 4+/5   Right Hip ABduction 3+/5   Left Hip Flexion 4+/5   Left Hip ABduction 4-/5   Right Knee Flexion 4/5   Right Knee Extension 4/5   Left Knee Flexion 4+/5   Left Knee Extension 4+/5   Flexibility   Hamstrings R 60  L 65   Palpation   Palpation Pt with decreased Right patellar mobility   Ambulation/Gait   Ambulation/Gait Yes   Ambulation/Gait  Assistance 7: Independent   Assistive device None   Gait Pattern Wide base of support;Abducted- right;Antalgic;Decreased weight shift to right;Decreased hip/knee flexion - right;Decreased stance time - right                   [facility] Adult PT Treatment/Exercise - 04/15/15 0810    Knee/Hip Exercises: Stretches   Active Hamstring Stretch 2 reps;30 seconds  in sitting position   Passive Hamstring Stretch 2 reps;30 seconds   Knee/Hip Exercises: Supine   Quad Sets 2 sets;Right;10 reps  VC for tightening quad   Short Arc Quad Sets 2 sets;Right;10 reps   Heel Slides 10 reps;Right  VC   Terminal Knee Extension 10 reps;Right  VC   Straight Leg Raises 2 sets;Right;10 reps  VC                PT Education - 04/15/15 1305    Education provided Yes   Education Details Pt educated on sitting and standing posture, LEvel 1 strengthening and Hamstring stretch   Person(s) Educated Patient   Methods Explanation;Demonstration;Tactile cues;Verbal cues;Handout   Comprehension Verbalized understanding;Returned demonstration;Need further instruction          PT Short Term Goals - 04/15/15 1322    PT SHORT TERM GOAL #1   Title "Independent with initial HEP   Time 4   Period Weeks   Status New   PT SHORT TERM GOAL #2   Title "Report pain decrease at rest from   /10 to   /10.   Time 4   Period Weeks   Status New   PT SHORT TERM GOAL #3   Title "Demonstrate and verbalize understanding of condition management including RICE, positioning, use of A.D., HEP.    Time 4   Period Weeks   Status New           PT Long Term Goals - 04/15/15 1327    PT LONG TERM GOAL #1   Title Pt will be independent with advance HEP   Time 8   Period Weeks   Status New   PT LONG TERM GOAL #2   Title "Pain will decrease to 1/10 or less with all functional activities   Time 8   Period Weeks   Status New   PT LONG TERM GOAL #3   Title "FOTO will improve from  59% limitation  to  33%    indicating improved functional mobility    Time 8   Period Weeks   Status New   PT LONG TERM GOAL #4   Title "Pt  will tolerate standing and walking for 2 hours without increased pain in order to return to PLOF   Time 8   Period Weeks   Status New   PT LONG TERM GOAL #5   Title Pt will be able to negotiate stairs (at least 12 steps continuously)without exacerbating pain symptoms    Time 8   Period Weeks   Status New   Additional Long Term Goals   Additional Long Term Goals Yes   PT LONG TERM GOAL #6   Title Pt will increase AROM of right and left knee to at least 125 bilaterally in order to get in and out of care without pain   Time 8   Period Weeks   Status New               Plan - 04/15/15 1307    Clinical Impression Statement Pt with Right meniscal tear in the last month.  Insidious onset. Pt just woke up one morning with knee pain. 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 Pt has pain with negotiation of steps.  Pt  has decreased Right patellar mobility and tight R IT band.  and hamstrings about 65 degrees.  Pt with generalized weakness and decreased AROM of R knee compared to Left. and obesity.     Pt will benefit from skilled therapeutic intervention in order to improve on the following deficits Abnormal gait;Decreased activity tolerance;Decreased mobility;Decreased range of motion;Postural dysfunction;Difficulty walking;Decreased strength;Increased fascial restricitons;Obesity;Pain  iontophoresis   Rehab Potential Good   PT Frequency 2x / week   PT Duration 8 weeks   PT Treatment/Interventions ADLs/Self Care Home Management;Cryotherapy;Moist Heat;Ultrasound;Electrical Stimulation;Stair training;Gait training;Dry needling;Passive range of motion;Patient/family education;Neuromuscular re-education;Functional mobility training;Therapeutic exercise;Therapeutic activities   PT Next Visit Plan Review initial HEP and progress as  tolerated.         Problem List Patient Active Problem List   Diagnosis Date Noted  . Acute medial meniscal tear 03/19/2015  . Severe obesity (BMI >= 40) 05/04/2014  . Gastroenteritis 02/04/2013  . GERD (gastroesophageal reflux disease) 12/05/2011  . Migraine headache 12/05/2011    Voncille Lo, PT 04/15/2015 1:39 PM Phone: [phone] Fax: [facility], [place], [address] Phone: [phone]   Fax:  [phone]

## 2015-04-15 NOTE — Patient Instructions (Signed)
Posture Tips DO: - stand tall and erect - keep chin tucked in - keep head and shoulders in alignment - check posture regularly in mirror or large window - pull head back against headrest in car seat;  Change your position often.  Sit with lumbar support. DON'T: - slouch or slump while watching TV or reading - sit, stand or lie in one position  for too long;  Sitting is especially hard on the spine so if you sit at a desk/use the computer, then stand up often!   Copyright  VHI. All rights reserved.  Posture - Standing   Good posture is important. Avoid slouching and forward head thrust. Maintain curve in low back and align ears over shoul- ders, hips over ankles.  Pull your belly button in toward your back bone. Even weight on great toe , little toe and heel.  Stand with soft knees and dont lean hips forward.  String from chest heart t;o sky   Copyright  VHI. All rights reserved.  Posture - Sitting   Sit upright, head facing forward. Try using a roll to support lower back. Keep shoulders relaxed, and avoid rounded back. Keep hips level with knees. Avoid crossing legs for long periods. Sit on sit bones not tail bone. With relaxed posture.     Copyright  VHI. All rights reserved.   Copyright  VHI. All rights reserved.  HIP: Flexion / KNEE: Extension, Straight Leg Raise   Raise leg, keeping knee straight. Perform slowly. _15__ reps per set, Do 2-3 sets in one session one fast, one slow and one hovering for 10 seconds.  Do exercise 2-3 times    Copyright  VHI. All rights reserved.  Heel Slide   Bend knee and pull heel toward buttocks. Hold _3-5___ seconds. Return. Repeat with other knee. Repeat _3 x 10___ times. Do _1-2__ sessions per day.  http://gt2.exer.us/372   Copyright  VHI. All rights reserved.     Raise leg until knee is straight. ___ reps per set, ___ sets per day, ___ days per week  Copyright  VHI. All rights reserved.  [employer] a large can  or rolled towel under leg. Straighten knee and leg. Hold _3-5___ seconds. Repeat with other leg. Repeat _10 x3__ times. Do _2-3__ sessions per day.  http://gt2.exer.us/365   Copyright  VHI. All rights reserved.  Quad Set   Slowly tighten muscles on thigh of straight leg while counting out loud to _5___. Repeat with other leg. Repeat _100___ times. Do __several__ sessions per day. Especially when watching TV  http://gt2.exer.us/361   Copyright  VHI. All rights reserved.  Leg Extension (Hamstring)   Sit toward front edge of chair, with leg out straight, heel on floor, toes pointing toward body. Keeping back straight, bend forward at hip, breathing out through pursed lips. Return, breathing in. Repeat _2-3__ times. Repeat with other leg. Do _2-3__ sessions per day.   Hamstring Step 3   Left leg in maximal straight leg raise, heel at maximal stretch, straighten knee further by tightening knee cap. Warning: Intense stretch. Stay within tolerance. Hold _30-60__ seconds. Relax knee cap only. Repeat _2-3__ times  2 times a day  For IT band stretch, bring Right great toe over Left shoulder   2-3 times for 30-60 seconds 1-2 times a day Voncille Lo, PT 04/15/2015 8:37 AM Phone: [phone] Fax: [phone]

## 2015-04-27 ENCOUNTER — Ambulatory Visit: Payer: PRIVATE HEALTH INSURANCE | Attending: Family Medicine | Admitting: Physical Therapy

## 2015-04-27 DIAGNOSIS — M25561 Pain in right knee: Secondary | ICD-10-CM | POA: Diagnosis present

## 2015-04-27 NOTE — Patient Instructions (Signed)
Step Down: Anterior   From 4-6" step, reach one leg forward as far as possible, still able to return easily. Touch toe and heel to floor. Return to single leg balance. Repeat with other leg. Repeat __20_ times. Do __1-2__ sessions per day. At start, hold ____ pound dumbbells at: knee height. shoulder height. waist height. On return, bring weights: to waist. to shoulders. over head.  Copyright  VHI. All rights reserved.  Step-Up: Lateral   Step up to side with right leg. Bring other foot up onto _6___ inch step. Return to floor position with left leg. Repeat ___20_ times per session. Do __1-2__ sessions per day. Repeat in dimly lit room. Repeat with eyes closed.  Copyright  VHI. All rights reserved.    http://bt.exer.us/154   Copyright  VHI. All rights reserved.  Body-Weight Step-Up: Stable (Active)   Head up, back flat, place one foot on step. Bring other leg up on to step. Complete _2 sets of _20_ repetitions. Perform _1-2__ sessions per day.  http://gtsc.exer.us/512   Copyright  VHI. All rights reserved.  Functional Quadriceps: Sit to Stand   Sit on edge of chair, feet flat on floor. Stand upright, extending knees fully. Repeat _2 x10___ times per set. Do __2__ sets per session. Do ___1-2_ sessions per day.  http://orth.exer.us/734   Copyright  VHI. All rights reserved.  Strengthening: Wall Slide   Leaning on wall, slowly lower buttocks until thighs are parallel to floor. Hold _3-5___ seconds. Tighten thigh muscles and return. Repeat __10__ times per set. Do __2__ sets per session. Do 1-2____ sessions per day.  http://orth.exer.us/630   Copyright  VHI. All rights reserved.  Voncille Lo, PT 04/27/2015 8:50 AM Phone: [phone] Fax: [phone]

## 2015-04-27 NOTE — Therapy (Signed)
[place], [place], [address] Phone: [phone]   Fax:  [phone]  Physical Therapy Treatment  Patient Details  Name: Shane Brewer MRN: [id number] Date of Birth: [birth year] Referring Provider:  Lyndal Pulley, DO  Encounter Date: 04/27/2015      PT End of Session - 04/27/15 0844    Visit Number 2   Number of Visits 16   Date for PT Re-Evaluation 06/10/15   PT Start Time 0844   Activity Tolerance Patient tolerated treatment well   Behavior During Therapy [facility] for tasks assessed/performed      Past Medical History  Diagnosis Date  . Arthritis   . GERD (gastroesophageal reflux disease)   . Migraine     Past Surgical History  Procedure Laterality Date  . Wrist surgery  2000    right wrist injury    There were no vitals filed for this visit.  Visit Diagnosis:  Right knee pain      Subjective Assessment - 04/27/15 0845    Subjective It is easier to get up stairs and my pain has decreased to 4/10   How long can you sit comfortably? unlimited   How long can you stand comfortably? 1 hour   How long can you walk comfortably? 1 hour    Patient Stated Goals I would like to be painfree .    Currently in Pain? Yes   Pain Score 4    Pain Location Knee   Pain Orientation Right   Pain Descriptors / Indicators Aching   Pain Type Chronic pain   Pain Onset More than a month ago   Pain Frequency Intermittent            [facility] PT Assessment - 04/27/15 0001    AROM   Right Knee Extension 5   Right Knee Flexion 135                     [facility] Adult PT Treatment/Exercise - 04/27/15 0001    Knee/Hip Exercises: Stretches   Passive Hamstring Stretch 2 reps;30 seconds  right with VC   Quad Stretch 2 reps;30 seconds  in prone position with PT adding overpressure   ITB Stretch 2 reps;30 seconds  VC with strap in supine   Knee/Hip Exercises: Aerobic   Stationary Bike Level 4 6  minutes   Knee/Hip Exercises: Standing   Lateral Step Up 2 sets;10 reps;Step Height: 6"  right only VC   Forward Step Up 2 sets;10 reps;Step Height: 6"  right only   Step Down 2 sets;10 reps;Right;Step Height: 8"   Wall Squat 10 reps;2 sets;10 seconds  x2 VC for foot placement   Wall Squat Limitations Also sit to stand x 10 on mat table with eccentric control   Other Standing Knee Exercises Blue T band shuffle walk 4 x 10 ft   Other Standing Knee Exercises monster walk to the side 8 steps ,6,4 , 2 and 8 1 steps lateral slide   Manual Therapy   Manual Therapy Joint mobilization   Joint Mobilization Right patellar mobs lateral to medial grade 3                 PT Education - 04/27/15 0921    Education Details Pt given updated HEP for standing knee exercises,    Person(s) Educated Patient   Methods Explanation;Demonstration;Tactile cues;Verbal cues   Comprehension Verbalized understanding;Returned demonstration;Verbal cues required  PT Short Term Goals - 04/27/15 0844    PT SHORT TERM GOAL #1   Title "Independent with initial HEP   Time 4   Period Weeks   Status On-going   PT SHORT TERM GOAL #2   Title "Report pain decrease at rest from   /10 to   /10.   Time 4   Period Weeks   Status On-going   PT SHORT TERM GOAL #3   Title "Demonstrate and verbalize understanding of condition management including RICE, positioning, use of A.D., HEP.    Time 4   Period Weeks   Status On-going           PT Long Term Goals - 04/27/15 0844    PT LONG TERM GOAL #1   Title Pt will be independent with advance HEP   Time 8   Period Weeks   Status On-going   PT LONG TERM GOAL #2   Title "Pain will decrease to 1/10 or less with all functional activities   Time 8   Period Weeks   Status On-going   PT LONG TERM GOAL #3   Title "FOTO will improve from  59% limitation  to  33%   indicating improved functional mobility    Time 8   Period Weeks   Status On-going   PT  LONG TERM GOAL #4   Title "Pt will tolerate standing and walking for 2 hours without increased pain in order to return to PLOF   Time 8   Period Weeks   Status On-going   PT LONG TERM GOAL #5   Title Pt will be able to negotiate stairs (at least 12 steps continuously)without exacerbating pain symptoms    Time 8   Period Weeks   Status On-going   PT LONG TERM GOAL #6   Title Pt will increase AROM of right and left knee to at least 125 bilaterally in order to get in and out of care without pain   Time 8   Period Weeks   Status On-going               Plan - 04/27/15 6222    Clinical Impression Statement Pt with improved pain from 8/10 to 4/10 on right knee and able to perform more strenuous exercise to day. Pt with increased patellar mobility and improved AROM of Righ knee flex to 135   Pt will benefit from skilled therapeutic intervention in order to improve on the following deficits Abnormal gait;Decreased activity tolerance;Decreased mobility;Decreased range of motion;Postural dysfunction;Difficulty walking;Decreased strength;Increased fascial restricitons;Obesity;Pain   Rehab Potential Good   PT Frequency 2x / week   PT Duration 8 weeks   PT Treatment/Interventions ADLs/Self Care Home Management;Cryotherapy;Moist Heat;Ultrasound;Electrical Stimulation;Stair training;Gait training;Dry needling;Passive range of motion;Patient/family education;Neuromuscular re-education;Functional mobility training;Therapeutic exercise;Therapeutic activities   PT Next Visit Plan continue with clamshells and hip strengthening exercises   PT Home Exercise Plan HEP for standing knee exericises   Consulted and Agree with Plan of Care Patient        Problem List Patient Active Problem List   Diagnosis Date Noted  . Acute medial meniscal tear 03/19/2015  . Severe obesity (BMI >= 40) 05/04/2014  . Gastroenteritis 02/04/2013  . GERD (gastroesophageal reflux disease) 12/05/2011  . Migraine  headache 12/05/2011   Voncille Lo, PT 04/27/2015 9:30 AM Phone: [phone] Fax: [facility] [place], [place], [address] Phone: [phone]   Fax:  [phone]

## 2015-04-29 ENCOUNTER — Ambulatory Visit: Payer: PRIVATE HEALTH INSURANCE | Admitting: Physical Therapy

## 2015-04-29 DIAGNOSIS — M25561 Pain in right knee: Secondary | ICD-10-CM | POA: Diagnosis not present

## 2015-04-29 NOTE — Therapy (Signed)
[place], [place], [address] Phone: [phone]   Fax:  [phone]  Physical Therapy Treatment  Patient Details  Name: Shane Brewer MRN: [id number] Date of Birth: [birth year] Referring Provider:  Eulas Post, MD  Encounter Date: 04/29/2015      PT End of Session - 04/29/15 0802    Visit Number 3   Number of Visits 16   Date for PT Re-Evaluation 06/10/15   PT Start Time 0742   PT Stop Time 0804   PT Time Calculation (min) 22 min   Activity Tolerance Patient tolerated treatment well      Past Medical History  Diagnosis Date  . Arthritis   . GERD (gastroesophageal reflux disease)   . Migraine     Past Surgical History  Procedure Laterality Date  . Wrist surgery  2000    right wrist injury    There were no vitals filed for this visit.  Visit Diagnosis:  Right knee pain      Subjective Assessment - 04/29/15 0746    Subjective sore this am.  has a HA.  Has done his exercises   Currently in Pain? Yes   Pain Score 2    Pain Location Knee   Pain Orientation Right   Pain Descriptors / Indicators Sore   Pain Frequency Intermittent   Aggravating Factors  LAQ   Pain Relieving Factors Ice,  ibuprophen                         [facility] Adult PT Treatment/Exercise - 04/29/15 0748    Knee/Hip Exercises: Stretches   Active Hamstring Stretch 3 reps;30 seconds  strap   ITB Stretch 3 reps;30 seconds  with chart  for anatomy review   Gastroc Stretch 3 reps;30 seconds   Knee/Hip Exercises: Sidelying   Clams 20 reps  blue band , instructionm monitotrd for technique   Knee/Hip Exercises: Prone   Hip Extension 10 reps  hurts weight bear Knee so did flat, 10 reps each                  PT Short Term Goals - 04/29/15 0800    PT SHORT TERM GOAL #3   Title "Demonstrate and verbalize understanding of condition management including RICE, positioning, use of A.D., HEP.     Baseline understands   Time 4   Period Weeks   Status Achieved           PT Long Term Goals - 04/27/15 0844    PT LONG TERM GOAL #1   Title Pt will be independent with advance HEP   Time 8   Period Weeks   Status On-going   PT LONG TERM GOAL #2   Title "Pain will decrease to 1/10 or less with all functional activities   Time 8   Period Weeks   Status On-going   PT LONG TERM GOAL #3   Title "FOTO will improve from  59% limitation  to  33%   indicating improved functional mobility    Time 8   Period Weeks   Status On-going   PT LONG TERM GOAL #4   Title "Pt will tolerate standing and walking for 2 hours without increased pain in order to return to PLOF   Time 8   Period Weeks   Status On-going   PT LONG TERM GOAL #5   Title Pt will be able to negotiate stairs (at  least 12 steps continuously)without exacerbating pain symptoms    Time 8   Period Weeks   Status On-going   PT LONG TERM GOAL #6   Title Pt will increase AROM of right and left knee to at least 125 bilaterally in order to get in and out of care without pain   Time 8   Period Weeks   Status On-going               Plan - 04/29/15 0805    Clinical Impression Statement short session, patient late.  STG# 3 met   PT Next Visit Plan stretch, strength    Consulted and Agree with Plan of Care Patient        Problem List Patient Active Problem List   Diagnosis Date Noted  . Acute medial meniscal tear 03/19/2015  . Severe obesity (BMI >= 40) 05/04/2014  . Gastroenteritis 02/04/2013  . GERD (gastroesophageal reflux disease) 12/05/2011  . Migraine headache 12/05/2011    [facility] 04/29/2015, 8:07 AM  [facility] [address] [place], [place], [address] Phone: [phone]   Fax:  [phone] Melvenia Needles, PTA 04/29/2015 8:07 AM Phone: [phone] Fax: [phone]

## 2015-05-04 ENCOUNTER — Ambulatory Visit: Payer: PRIVATE HEALTH INSURANCE | Admitting: Physical Therapy

## 2015-05-04 DIAGNOSIS — M25561 Pain in right knee: Secondary | ICD-10-CM | POA: Diagnosis not present

## 2015-05-04 NOTE — Therapy (Signed)
[place], [place], [address] Phone: [phone]   Fax:  [phone]  Physical Therapy Treatment  Patient Details  Name: Shane Brewer MRN: [id number] Date of Birth: 11-23-1976 Referring Provider:  Eulas Post, MD  Encounter Date: 05/04/2015      PT End of Session - 05/04/15 0759    Visit Number 4   Number of Visits 16   Date for PT Re-Evaluation 06/10/15   PT Start Time 0733   PT Stop Time 0800   PT Time Calculation (min) 27 min   Activity Tolerance Patient tolerated treatment well      Past Medical History  Diagnosis Date  . Arthritis   . GERD (gastroesophageal reflux disease)   . Migraine     Past Surgical History  Procedure Laterality Date  . Wrist surgery  2000    right wrist injury    There were no vitals filed for this visit.  Visit Diagnosis:  Right knee pain      Subjective Assessment - 05/04/15 0736    Subjective Has been doing exercises.  2-3/10 dull ach constant   Currently in Pain? Yes   Pain Score 3    Pain Location Knee   Pain Orientation Right   Pain Descriptors / Indicators Aching;Dull   Pain Type Chronic pain   Pain Frequency Constant   Aggravating Factors  squatting   Pain Relieving Factors ice ibuprophen   Effect of Pain on Daily Activities avoids squatting   Multiple Pain Sites No                         [facility] Adult PT Treatment/Exercise - 05/04/15 0741    Knee/Hip Exercises: Stretches   Passive Hamstring Stretch 3 reps;30 seconds   Gastroc Stretch 3 reps;30 seconds  Both on step   Knee/Hip Exercises: Standing   Heel Raises 10 reps   Forward Step Up Right;2 sets;15 reps;Hand Hold: 0;Step Height: 4"   Wall Squat 10 reps;10 seconds  40-45 degrees with instructions   Knee/Hip Exercises: Supine   Heel Slides AROM;Right;2 sets;15 reps   Heel Slides Limitations 130 degrees AROM/ Full extension RT   Straight Leg Raises Right;2 sets;15  reps   Knee/Hip Exercises: Prone   Hip Extension AROM;Strengthening;Right;2 sets;15 reps   Other Prone Exercises terminal knee extenstion 10 reps with instruction                  PT Short Term Goals - 05/04/15 0800    PT SHORT TERM GOAL #1   Title "Independent with initial HEP   Time 4   Period Weeks   Status Achieved   PT SHORT TERM GOAL #2   Title "Report pain decrease at rest from   /10 to   /10.   Baseline 3/10 today   Time 4   Period Weeks   Status On-going   PT SHORT TERM GOAL #3   Title "Demonstrate and verbalize understanding of condition management including RICE, positioning, use of A.D., HEP.    Status Achieved           PT Long Term Goals - 05/04/15 0801    PT LONG TERM GOAL #1   Title Pt will be independent with advance HEP   Time 8   Period Weeks   Status On-going   PT LONG TERM GOAL #2   Title "Pain will decrease to 1/10 or less with all functional activities  Baseline 3/10   Time 8   Period Weeks   Status On-going   PT LONG TERM GOAL #3   Title "FOTO will improve from  59% limitation  to  33%   indicating improved functional mobility    Time 8   Period Weeks   Status Unable to assess   PT LONG TERM GOAL #4   Title "Pt will tolerate standing and walking for 2 hours without increased pain in order to return to PLOF   Time 8   Status Unable to assess   PT LONG TERM GOAL #5   Title Pt will be able to negotiate stairs (at least 12 steps continuously)without exacerbating pain symptoms    Time 8   Period Weeks   Status On-going   PT LONG TERM GOAL #6   Title Pt will increase AROM of right and left knee to at least 125 bilaterally in order to get in and out of care without pain   Baseline 130   Time 8   Period Weeks   Status Achieved               Plan - 05/04/15 0800    Clinical Impression Statement Progress toward goals, see goals..  Patient wants to use visits scheduled then be discharged.         Problem  List Patient Active Problem List   Diagnosis Date Noted  . Acute medial meniscal tear 03/19/2015  . Severe obesity (BMI >= 40) 05/04/2014  . Gastroenteritis 02/04/2013  . GERD (gastroesophageal reflux disease) 12/05/2011  . Migraine headache 12/05/2011    HARRIS,KAREN 05/04/2015, 8:11 AM  [facility] [address] [place], [place], [address] Phone: [phone]   Fax:  [phone]

## 2015-05-06 ENCOUNTER — Ambulatory Visit: Payer: PRIVATE HEALTH INSURANCE | Admitting: Physical Therapy

## 2015-05-06 DIAGNOSIS — M25561 Pain in right knee: Secondary | ICD-10-CM | POA: Diagnosis not present

## 2015-05-06 IMAGING — CR DG KNEE COMPLETE 4+V*R*
5 series · 5 of 5 positions shown · non-contrast
Comparison: None.

CLINICAL DATA: Pain swelling. No known injury. Initial evaluation.

EXAM:
RIGHT KNEE - COMPLETE 4+ VIEW

[view not recorded (1 of 5)]
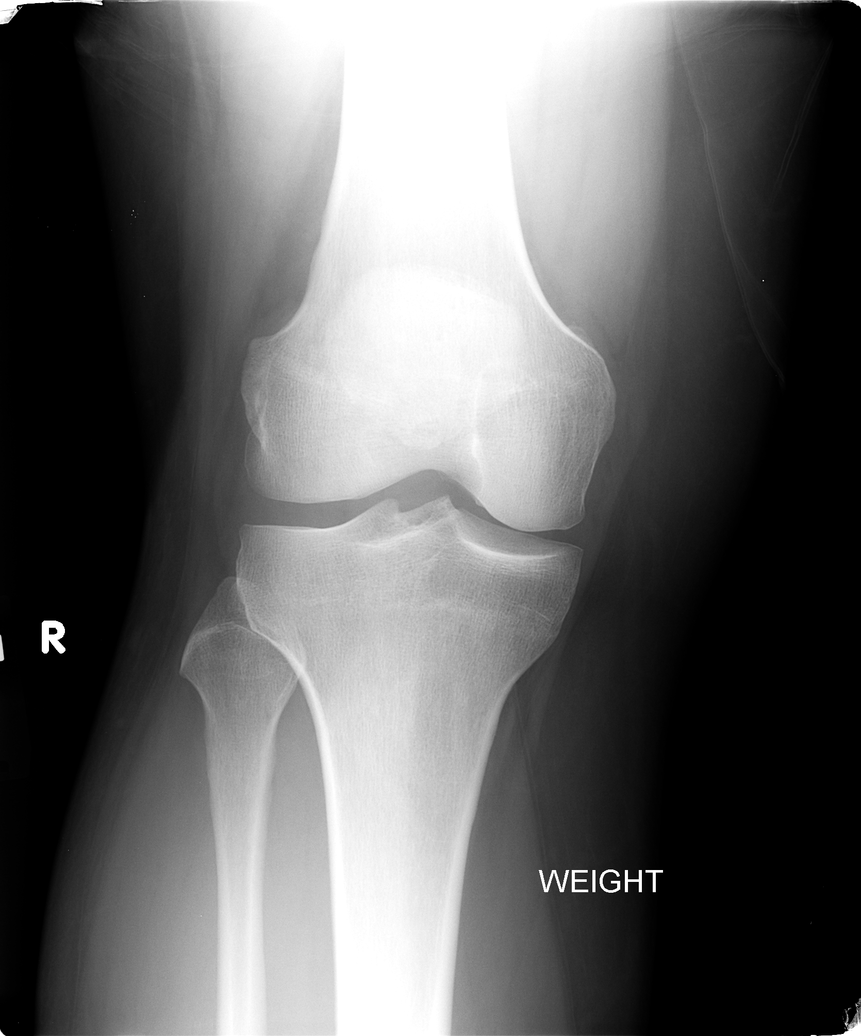

[view not recorded (2 of 5)]
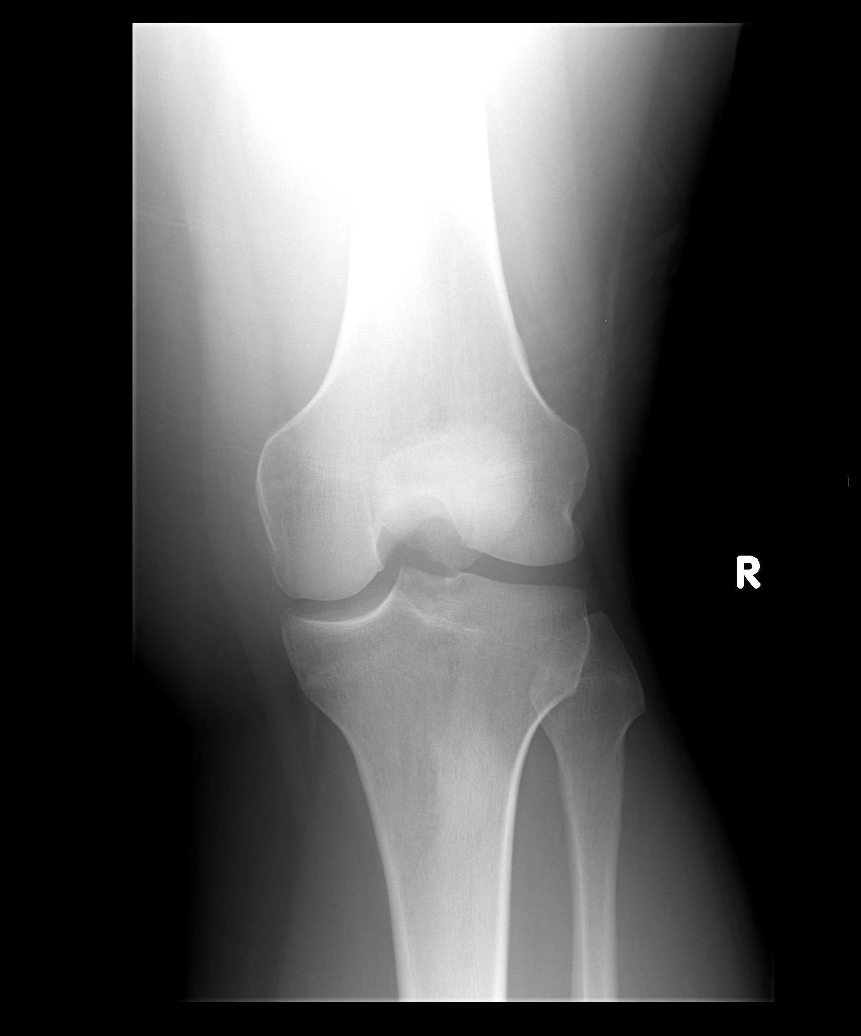

[view not recorded (3 of 5)]
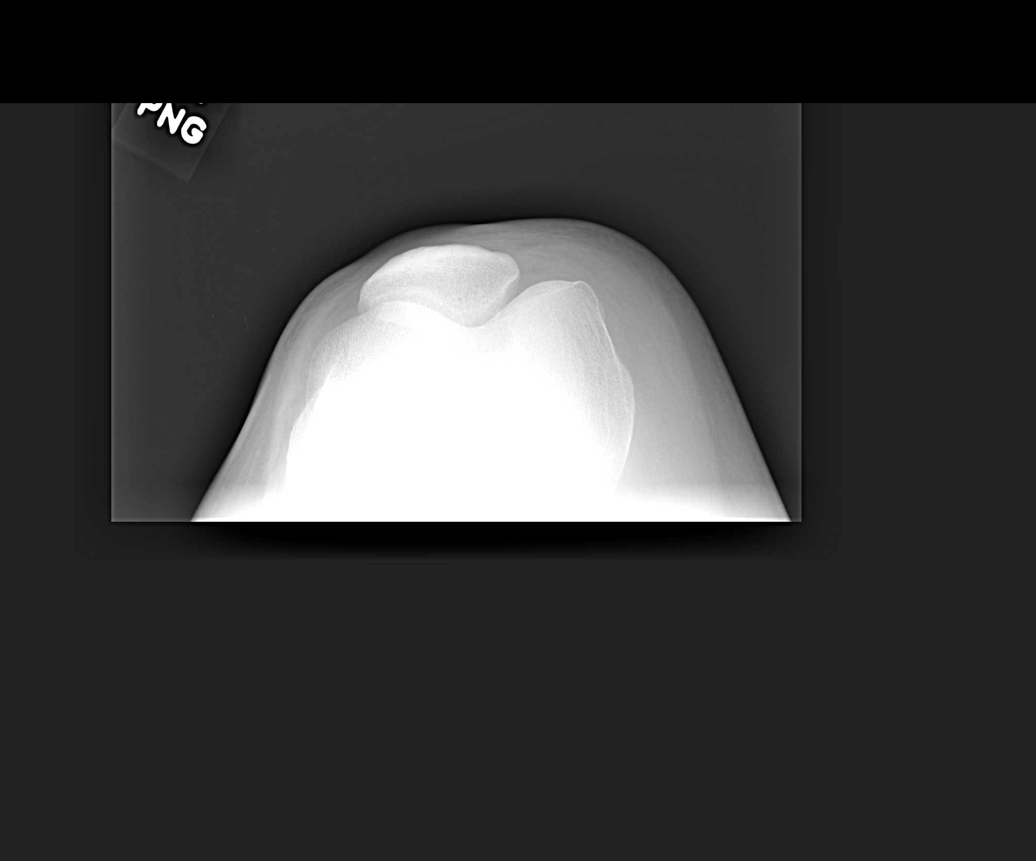

[view not recorded (4 of 5)]
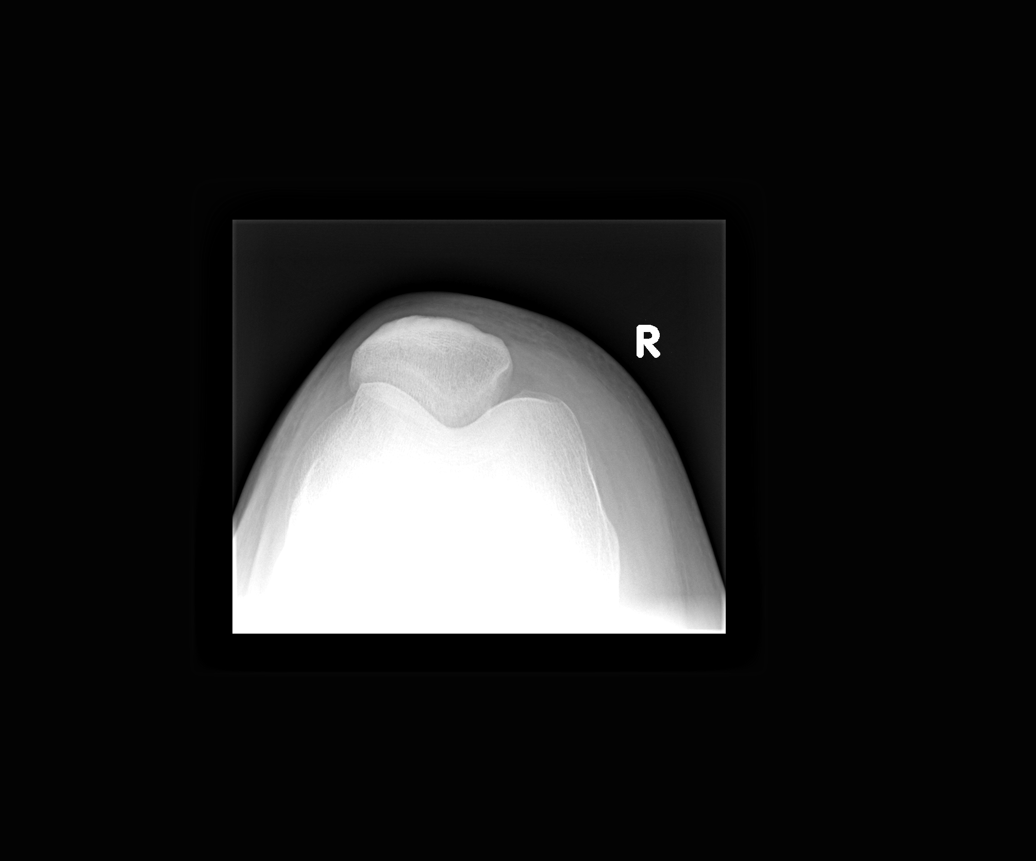

[view not recorded (5 of 5)]
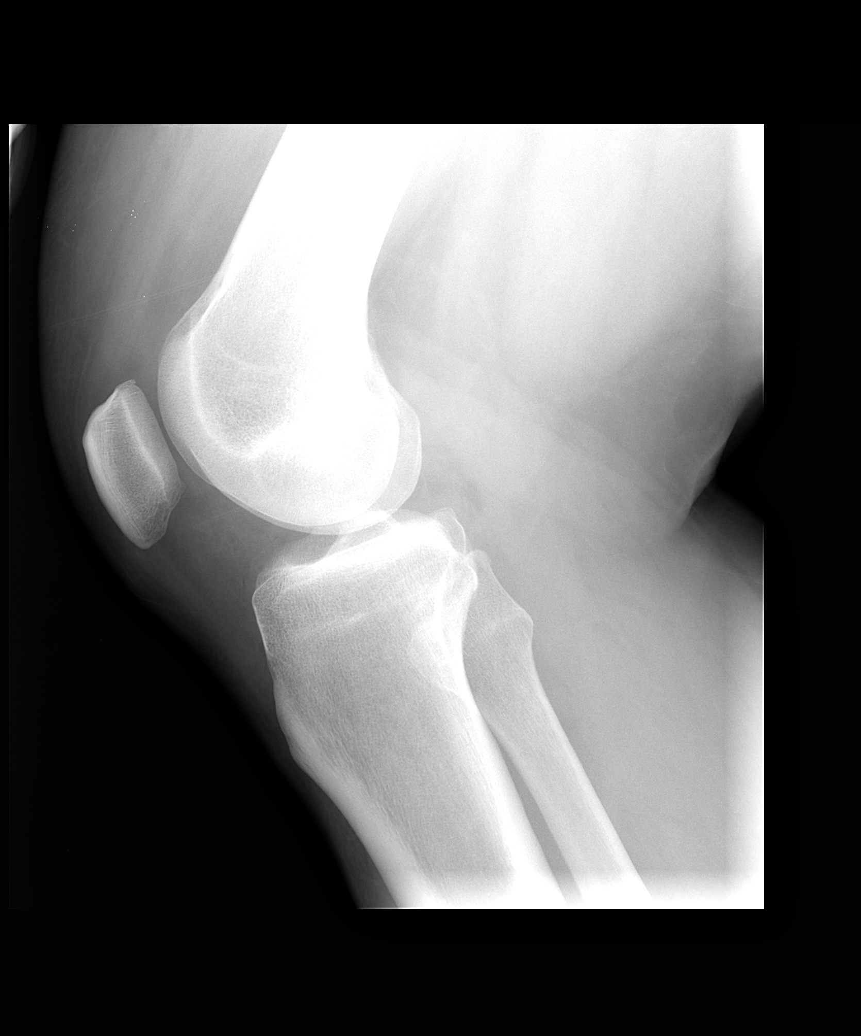

[5 of 5 positions shown; findings below may reference images not displayed]

FINDINGS: There is no evidence of fracture, dislocation, or joint effusion.
There is no evidence of arthropathy or other focal bone abnormality.
Soft tissues are unremarkable.
IMPRESSION: Negative.

## 2015-05-06 NOTE — Therapy (Addendum)
[place], [place], [address] Phone: [phone]   Fax:  [phone]  Physical Therapy Treatment/Discharge Note  Patient Details  Name: Shane Brewer MRN: [id number] Date of Birth: 04-Sep-1976 Referring Provider:  Eulas Post, MD  Encounter Date: 05/06/2015      PT End of Session - 05/06/15 1809    Visit Number 5   Number of Visits 16   Date for PT Re-Evaluation 06/10/15   PT Start Time 0732   PT Stop Time 0805   PT Time Calculation (min) 33 min   Activity Tolerance Patient tolerated treatment well   Behavior During Therapy [facility] for tasks assessed/performed      Past Medical History  Diagnosis Date  . Arthritis   . GERD (gastroesophageal reflux disease)   . Migraine     Past Surgical History  Procedure Laterality Date  . Wrist surgery  2000    right wrist injury    There were no vitals filed for this visit.  Visit Diagnosis:  Right knee pain      Subjective Assessment - 05/06/15 0734    Subjective Pain just like the other day.  I don't feel the swelling in it any more.                         [facility] Adult PT Treatment/Exercise - 05/06/15 0735    Knee/Hip Exercises: Stretches   Active Hamstring Stretch 3 reps;30 seconds   Knee/Hip Exercises: Standing   Heel Raises 3 sets  single leg   Forward Step Up --  7.25 inches 20 reps   Wall Squat 1 set;10 reps   Rocker Board --  10 reps, 4 footed, mini squats.   SLS with Vectors --  4 way SLT standing Rt, BAND Lt 10 reps   Rebounder --  10 reps 2 sets, 1 set on pod   Other Standing Knee Exercises 1 legged squat10 , No hands SBA   Other Standing Knee Exercises Hip hinge single leg.                  PT Short Term Goals - 05/06/15 1815    PT SHORT TERM GOAL #2   Baseline 3/10   Time 4   Period Weeks   Status Partially Met   PT SHORT TERM GOAL #3   Title "Demonstrate and verbalize understanding of  condition management including RICE, positioning, use of A.D., HEP.    Status Achieved           PT Long Term Goals - 05/06/15 1815    PT LONG TERM GOAL #1   Title Pt will be independent with advance HEP   Time 8   Period Weeks   Status On-going   PT LONG TERM GOAL #2   Title "Pain will decrease to 1/10 or less with all functional activities   Baseline 3/10   Time 8   Period Weeks   Status Partially Met   PT LONG TERM GOAL #3   Title "FOTO will improve from  59% limitation  to  33%   indicating improved functional mobility    Baseline 11% limited   Time 8   Status Achieved   PT LONG TERM GOAL #4   Title "Pt will tolerate standing and walking for 2 hours without increased pain in order to return to PLOF   Time 8   Period Weeks   Status Achieved  PT LONG TERM GOAL #5   Title Pt will be able to negotiate stairs (at least 12 steps continuously)without exacerbating pain symptoms    Time 8   Period Weeks   Status Achieved   PT LONG TERM GOAL #6   Title Pt will increase AROM of right and left knee to at least 125 bilaterally in order to get in and out of care without pain   Baseline 130   Time 8   Period Weeks   Status Achieved               Plan - 05/06/15 1810    Clinical Impression Statement Patient thinks he is ready for discharge.  He is independent with home exercises and pain is minimal.  Most goals met strength quads and Hamstrings 5/5.   PT Next Visit Plan Discharge   Consulted and Agree with Plan of Care Patient        Problem List Patient Active Problem List   Diagnosis Date Noted  . Acute medial meniscal tear 03/19/2015  . Severe obesity (BMI >= 40) 05/04/2014  . Gastroenteritis 02/04/2013  . GERD (gastroesophageal reflux disease) 12/05/2011  . Migraine headache 12/05/2011    [facility] 05/06/2015, 6:22 PM  [facility] [address] [place], [place], [address] Phone: [phone]   Fax:   [phone]  Melvenia Needles, PTA 05/06/2015 6:22 PM Phone: [phone] Fax: [phone]  PHYSICAL THERAPY DISCHARGE SUMMARY  Visits from Start of Care: 5  Current functional level related to goals / functional outcomes: See goals above  FOTO Eval limitation 59%, predicted 33% Discharge limitation 11%   Remaining deficits: Minimal pain 1/10- 3/10   Education / Equipment: HEP Plan: Patient agrees to discharge.  Patient goals were partially met. Patient is being discharged due to the patient's request.  ?????and being pleases with current functional level  Voncille Lo, PT 05/11/2015 8:35 AM Phone: [phone] Fax: [phone]

## 2015-05-11 ENCOUNTER — Ambulatory Visit: Payer: PRIVATE HEALTH INSURANCE | Admitting: Physical Therapy

## 2015-05-13 ENCOUNTER — Encounter: Payer: PRIVATE HEALTH INSURANCE | Admitting: Physical Therapy

## 2015-06-04 ENCOUNTER — Other Ambulatory Visit (INDEPENDENT_AMBULATORY_CARE_PROVIDER_SITE_OTHER): Payer: PRIVATE HEALTH INSURANCE

## 2015-06-04 DIAGNOSIS — Z0000 Encounter for general adult medical examination without abnormal findings: Secondary | ICD-10-CM

## 2015-06-04 LAB — LIPID PANEL
CHOL/HDL RATIO: 5
Cholesterol: 197 mg/dL (ref 0–200)
HDL: 41.5 mg/dL (ref >39.00)
LDL Cholesterol: 119 mg/dL — ABNORMAL HIGH (ref 0–99)
NONHDL: 155.5
Triglycerides: 185 mg/dL — ABNORMAL HIGH (ref 0.0–149.0)
VLDL: 37 mg/dL (ref 0.0–40.0)

## 2015-06-04 LAB — COMPREHENSIVE METABOLIC PANEL
ALK PHOS: 57 U/L (ref 39–117)
ALT: 20 U/L (ref 0–53)
AST: 15 U/L (ref 0–37)
Albumin: 3.9 g/dL (ref 3.5–5.2)
BILIRUBIN TOTAL: 0.5 mg/dL (ref 0.2–1.2)
BUN: 15 mg/dL (ref 6–23)
CO2: 29 meq/L (ref 19–32)
CREATININE: 0.86 mg/dL (ref 0.40–1.50)
Calcium: 9.2 mg/dL (ref 8.4–10.5)
Chloride: 104 mEq/L (ref 96–112)
GFR: 104.07 mL/min (ref >60.00)
GLUCOSE: 109 mg/dL — AB (ref 70–99)
Potassium: 4.5 mEq/L (ref 3.5–5.1)
SODIUM: 138 meq/L (ref 135–145)
Total Protein: 6.8 g/dL (ref 6.0–8.3)

## 2015-06-04 LAB — CBC WITH DIFFERENTIAL/PLATELET
BASOS PCT: 0.4 % (ref 0.0–3.0)
Basophils Absolute: 0 10*3/uL (ref 0.0–0.1)
EOS PCT: 2.4 % (ref 0.0–5.0)
Eosinophils Absolute: 0.2 10*3/uL (ref 0.0–0.7)
HCT: 44.3 % (ref 39.0–52.0)
Hemoglobin: 15 g/dL (ref 13.0–17.0)
LYMPHS PCT: 28.9 % (ref 12.0–46.0)
Lymphs Abs: 2.1 10*3/uL (ref 0.7–4.0)
MCHC: 33.9 g/dL (ref 30.0–36.0)
MCV: 88.1 fl (ref 78.0–100.0)
MONO ABS: 0.6 10*3/uL (ref 0.1–1.0)
MONOS PCT: 7.9 % (ref 3.0–12.0)
NEUTROS PCT: 60.4 % (ref 43.0–77.0)
Neutro Abs: 4.4 10*3/uL (ref 1.4–7.7)
PLATELETS: 288 10*3/uL (ref 150.0–400.0)
RBC: 5.03 Mil/uL (ref 4.22–5.81)
RDW: 13.3 % (ref 11.5–15.5)
WBC: 7.3 10*3/uL (ref 4.0–10.5)

## 2015-06-04 LAB — TSH: TSH: 0.66 u[IU]/mL (ref 0.35–4.50)

## 2015-06-11 ENCOUNTER — Encounter: Payer: Self-pay | Admitting: Family Medicine

## 2015-06-11 ENCOUNTER — Ambulatory Visit (INDEPENDENT_AMBULATORY_CARE_PROVIDER_SITE_OTHER): Payer: PRIVATE HEALTH INSURANCE | Admitting: Family Medicine

## 2015-06-11 VITALS — BP 120/80 | HR 95 | Temp 98.3°F | Ht 70.0 in | Wt 288.0 lb

## 2015-06-11 DIAGNOSIS — Z23 Encounter for immunization: Secondary | ICD-10-CM

## 2015-06-11 DIAGNOSIS — Z0000 Encounter for general adult medical examination without abnormal findings: Secondary | ICD-10-CM

## 2015-06-11 NOTE — Addendum Note (Signed)
Addended by: Marcina Millard on: 06/11/2015 02:40 PM   Modules accepted: Orders

## 2015-06-11 NOTE — Progress Notes (Signed)
Pre visit review using our clinic review tool, if applicable. No additional management support is needed unless otherwise documented below in the visit note. 

## 2015-06-11 NOTE — Progress Notes (Signed)
   Subjective:    Patient ID: Shane Brewer, male    DOB: 06-26-1976, 39 y.o.   MRN: [id number]  HPI Patient several complete physical. He has history of obesity and no other chronic medical problems. He had some mild weight gain during the past year. Is due for tetanus booster. Takes no regular prescription medications. Nonsmoker. Somewhat inconsistent exercise.  Reviewed with no changes:  Past Medical History  Diagnosis Date  . Arthritis   . GERD (gastroesophageal reflux disease)   . Migraine    Past Surgical History  Procedure Laterality Date  . Wrist surgery  2000    right wrist injury    reports that he has never smoked. He does not have any smokeless tobacco history on file. He reports that he drinks alcohol. He reports that he does not use illicit drugs. family history includes Alcohol abuse in his maternal uncle and paternal grandfather; Arthritis in his father and mother; Cancer in his paternal grandmother; Diabetes in his maternal grandmother; Heart disease in his maternal grandmother and paternal grandfather; Hypertension in his father, maternal grandmother, and paternal grandfather; Stroke in his maternal grandmother. Allergies  Allergen Reactions  . Strawberry       Review of Systems  Constitutional: Negative for fever, activity change, appetite change and fatigue.  HENT: Negative for congestion, ear pain and trouble swallowing.   Eyes: Negative for pain and visual disturbance.  Respiratory: Negative for cough, shortness of breath and wheezing.   Cardiovascular: Negative for chest pain and palpitations.  Gastrointestinal: Negative for nausea, vomiting, abdominal pain, diarrhea, constipation, blood in stool, abdominal distention and rectal pain.  Genitourinary: Negative for dysuria, hematuria and testicular pain.  Musculoskeletal: Negative for joint swelling and arthralgias.  Skin: Negative for rash.  Neurological: Negative for dizziness, syncope and headaches.    Hematological: Negative for adenopathy.  Psychiatric/Behavioral: Negative for confusion and dysphoric mood.       Objective:   Physical Exam  Constitutional: He is oriented to person, place, and time. He appears well-developed and well-nourished. No distress.  HENT:  Head: Normocephalic and atraumatic.  Right Ear: External ear normal.  Left Ear: External ear normal.  Mouth/Throat: Oropharynx is clear and moist.  Eyes: Conjunctivae and EOM are normal. Pupils are equal, round, and reactive to light.  Neck: Normal range of motion. Neck supple. No thyromegaly present.  Cardiovascular: Normal rate, regular rhythm and normal heart sounds.   No murmur heard. Pulmonary/Chest: No respiratory distress. He has no wheezes. He has no rales.  Abdominal: Soft. Bowel sounds are normal. He exhibits no distension and no mass. There is no tenderness. There is no rebound and no guarding.  Musculoskeletal: He exhibits no edema.  Lymphadenopathy:    He has no cervical adenopathy.  Neurological: He is alert and oriented to person, place, and time. He displays normal reflexes. No cranial nerve deficit.  Skin: No rash noted.  Psychiatric: He has a normal mood and affect.          Assessment & Plan:  Complete physical. Labs reviewed. He meets criteria for metabolic syndrome with elevated triglycerides, increased waist circumference, elevated glucose of 109, and borderline low HDL. We talked about implications. Strongly advise weight loss. Reduce sugar and starch intake. Consider A1c with labs at follow-up next year.

## 2015-06-11 NOTE — Patient Instructions (Signed)
Metabolic Syndrome, Adult Metabolic syndrome describes a group of risk factors for heart disease and diabetes. This syndrome has other names including Insulin Resistance Syndrome. The more risk factors you have, the higher your risk of having a heart attack, stroke, or developing diabetes. These risk factors include:  High blood sugar.  High blood triglyceride (a fat found in the blood) level.  High blood pressure.  Abdominal obesity (your extra weight is around your waist instead of your hips).  Low levels of high-density lipoprotein, HDL (good blood cholesterol). If you have any three of these risk factors, you have metabolic syndrome. If you have even one of these factors, you should make lifestyle changes to improve your health in order to prevent serious health diseases.  In people with metabolic syndrome, the cells do not respond properly to insulin. This can lead to high levels of glucose in the blood, which can interfere with normal body processes. Eventually, this can cause high blood pressure and higher fat levels in the blood, and inflammation of your blood vessels. The result can be heart disease and stroke.  CAUSES   Eating a diet rich in calories and saturated fat.  Too little physical activity.  Being overweight. Other underlying causes are:  Family history (genetics).  Ethnicity (South Asians are at a higher risk).  Older age (your chances of developing metabolic syndrome are higher as you grow older).  Insulin resistance. SYMPTOMS  By itself, metabolic syndrome has no symptoms. However, you might have symptoms of diabetes (high blood sugar) or high blood pressure, such as:  Increased thirst, urination, and tiredness.  Dizzy spells.  Dull headaches that are unusual for you.  Blurred vision.  Nosebleeds. DIAGNOSIS  Your caregiver may make a diagnosis of metabolic syndrome if you have at least three of these factors:  If you are overweight mostly around the  waist. This means a waistline greater than 40" in men and more than 35" in women. The waistline limits are 31 to 35 inches for women and 37 to 39 inches for men. In those who have certain genetic risk factors, such as having a family history of diabetes or being of Asian descent.  If you have a blood pressure of 130/85 mm Hg or more, or if you are being treated for high blood pressure.  If your blood triglyceride level is 150 mg/dL or more, or you are being treated for high levels of triglyceride.  If the level of HDL in your blood is below 40 mg/dL in men, less than 50 mg/dL in women, or you are receiving treatment for low levels of HDL.  If the level of sugar in your blood is high with fasting blood sugar level of 110 mg/dL or more, or you are under treatment for diabetes. TREATMENT  Your caregiver may have you make lifestyle changes, which may include:  Exercise.  Losing weight.  Maintaining a healthy diet.  Quitting smoking. The lifestyle changes listed above are key in reducing your risk for heart disease and stroke. Medicines may also be prescribed to help your body respond to insulin better and to reduce your blood pressure and blood fat levels. Aspirin may be recommended to reduce risks of heart disease or stroke.  HOME CARE INSTRUCTIONS   Exercise.  Measure your waist at regular intervals just above the hipbones after you have breathed out.  Maintain a healthy diet.  Eat fruits, such as apples, oranges, and pears.  Eat vegetables.  Eat legumes, such as kidney  beans, peas, and lentils.  Eat food rich in soluble fiber, such as whole grain cereal, oatmeal, and oat bran.  Use olive or safflower oils and avoid saturated fats.  Eat nuts.  Limit the amount of salt you eat or add to food.  Limit the amount of alcohol you drink.  Include fish in your diet, if possible.  Stop smoking if you are a smoker.  Maintain regular follow-up appointments.  Follow your  caregiver's advice. SEEK MEDICAL CARE IF:   You feel very tired or fatigued.  You develop excessive thirst.  You pass large quantities of urine.  You are putting on weight around your waist rather than losing weight.  You develop headaches over and over again.  You have off-and-on dizzy spells. SEEK IMMEDIATE MEDICAL CARE IF:   You develop nosebleeds.  You develop sudden blurred vision.  You develop sudden dizzy spells.  You develop chest pains, trouble breathing, or feel an abnormal or irregular heart beat.  You have a fainting episode.  You develop any sudden trouble speaking and/or swallowing.  You develop sudden weakness in one arm and/or one leg. MAKE SURE YOU:   Understand these instructions.  Will watch your condition.  Will get help right away if you are not doing well or get worse. Document Released: 03/12/2008 Document Revised: 04/20/2014 Document Reviewed: 03/12/2008 [facility] Patient Information 2015 [place], [place]. This information is not intended to replace advice given to you by your health care provider. Make sure you discuss any questions you have with your health care provider.  Food Choices to Lower Your Triglycerides  Triglycerides are a type of fat in your blood. High levels of triglycerides can increase the risk of heart disease and stroke. If your triglyceride levels are high, the foods you eat and your eating habits are very important. Choosing the right foods can help lower your triglycerides.  WHAT GENERAL GUIDELINES DO I NEED TO FOLLOW?  Lose weight if you are overweight.   Limit or avoid alcohol.   Fill one half of your plate with vegetables and green salads.   Limit fruit to two servings a day. Choose fruit instead of juice.   Make one fourth of your plate whole grains. Look for the word "whole" as the first word in the ingredient list.  Fill one fourth of your plate with lean protein foods.  Enjoy fatty fish (such as salmon,  mackerel, sardines, and tuna) three times a week.   Choose healthy fats.   Limit foods high in starch and sugar.  Eat more home-cooked food and less restaurant, buffet, and fast food.  Limit fried foods.  Cook foods using methods other than frying.  Limit saturated fats.  Check ingredient lists to avoid foods with partially hydrogenated oils (trans fats) in them. WHAT FOODS CAN I EAT?  Grains Whole grains, such as whole wheat or whole grain breads, crackers, cereals, and pasta. Unsweetened oatmeal, bulgur, barley, quinoa, or brown rice. Corn or whole wheat flour tortillas.  Vegetables Fresh or frozen vegetables (raw, steamed, roasted, or grilled). Green salads. Fruits All fresh, canned (in natural juice), or frozen fruits. Meat and Other Protein Products Ground beef (85% or leaner), grass-fed beef, or beef trimmed of fat. Skinless chicken or [place]. Ground chicken or [place]. Pork trimmed of fat. All fish and seafood. Eggs. Dried beans, peas, or lentils. Unsalted nuts or seeds. Unsalted canned or dry beans. Dairy Low-fat dairy products, such as skim or 1% milk, 2% or reduced-fat cheeses, low-fat ricotta  or cottage cheese, or plain low-fat yogurt. Fats and Oils Tub margarines without trans fats. Light or reduced-fat mayonnaise and salad dressings. Avocado. Safflower, olive, or canola oils. Natural peanut or almond butter. The items listed above may not be a complete list of recommended foods or beverages. Contact your dietitian for more options. WHAT FOODS ARE NOT RECOMMENDED?  Grains White bread. White pasta. White rice. Cornbread. Bagels, pastries, and croissants. Crackers that contain trans fat. Vegetables White potatoes. Corn. Creamed or fried vegetables. Vegetables in a cheese sauce. Fruits Dried fruits. Canned fruit in light or heavy syrup. Fruit juice. Meat and Other Protein Products Fatty cuts of meat. Ribs, chicken wings, bacon, sausage, bologna, salami, chitterlings,  fatback, hot dogs, bratwurst, and packaged luncheon meats. Dairy Whole or 2% milk, cream, half-and-half, and cream cheese. Whole-fat or sweetened yogurt. Full-fat cheeses. Nondairy creamers and whipped toppings. Processed cheese, cheese spreads, or cheese curds. Sweets and Desserts Corn syrup, sugars, honey, and molasses. Candy. Jam and jelly. Syrup. Sweetened cereals. Cookies, pies, cakes, donuts, muffins, and ice cream. Fats and Oils Butter, stick margarine, lard, shortening, ghee, or bacon fat. Coconut, palm kernel, or palm oils. Beverages Alcohol. Sweetened drinks (such as sodas, lemonade, and fruit drinks or punches). The items listed above may not be a complete list of foods and beverages to avoid. Contact your dietitian for more information. Document Released: 09/21/2004 Document Revised: 12/09/2013 Document Reviewed: 10/08/2013 [facility] Patient Information 2015 [place], [place]. This information is not intended to replace advice given to you by your health care provider. Make sure you discuss any questions you have with your health care provider.

## 2015-12-19 HISTORY — PX: SPINAL FUSION: SHX223

## 2016-06-08 ENCOUNTER — Other Ambulatory Visit (INDEPENDENT_AMBULATORY_CARE_PROVIDER_SITE_OTHER): Payer: 59

## 2016-06-08 DIAGNOSIS — Z0000 Encounter for general adult medical examination without abnormal findings: Secondary | ICD-10-CM

## 2016-06-08 LAB — HEPATIC FUNCTION PANEL
ALBUMIN: 4.2 g/dL (ref 3.5–5.2)
ALK PHOS: 52 U/L (ref 39–117)
ALT: 16 U/L (ref 0–53)
AST: 13 U/L (ref 0–37)
Bilirubin, Direct: 0.1 mg/dL (ref 0.0–0.3)
TOTAL PROTEIN: 6.7 g/dL (ref 6.0–8.3)
Total Bilirubin: 0.5 mg/dL (ref 0.2–1.2)

## 2016-06-08 LAB — CBC WITH DIFFERENTIAL/PLATELET
BASOS PCT: 0.6 % (ref 0.0–3.0)
Basophils Absolute: 0 10*3/uL (ref 0.0–0.1)
EOS PCT: 2.5 % (ref 0.0–5.0)
Eosinophils Absolute: 0.2 10*3/uL (ref 0.0–0.7)
HCT: 44.9 % (ref 39.0–52.0)
HEMOGLOBIN: 15.1 g/dL (ref 13.0–17.0)
LYMPHS ABS: 2 10*3/uL (ref 0.7–4.0)
Lymphocytes Relative: 25.8 % (ref 12.0–46.0)
MCHC: 33.6 g/dL (ref 30.0–36.0)
MCV: 88.1 fl (ref 78.0–100.0)
MONO ABS: 0.6 10*3/uL (ref 0.1–1.0)
MONOS PCT: 7.1 % (ref 3.0–12.0)
Neutro Abs: 5.1 10*3/uL (ref 1.4–7.7)
Neutrophils Relative %: 64 % (ref 43.0–77.0)
Platelets: 273 10*3/uL (ref 150.0–400.0)
RBC: 5.1 Mil/uL (ref 4.22–5.81)
RDW: 13.6 % (ref 11.5–15.5)
WBC: 7.9 10*3/uL (ref 4.0–10.5)

## 2016-06-08 LAB — BASIC METABOLIC PANEL
BUN: 13 mg/dL (ref 6–23)
CALCIUM: 9.1 mg/dL (ref 8.4–10.5)
CO2: 29 meq/L (ref 19–32)
CREATININE: 0.71 mg/dL (ref 0.40–1.50)
Chloride: 105 mEq/L (ref 96–112)
GFR: 129.19 mL/min (ref >60.00)
GLUCOSE: 97 mg/dL (ref 70–99)
Potassium: 4.4 mEq/L (ref 3.5–5.1)
SODIUM: 139 meq/L (ref 135–145)

## 2016-06-08 LAB — LIPID PANEL
CHOLESTEROL: 207 mg/dL — AB (ref 0–200)
HDL: 38.3 mg/dL — AB (ref >39.00)
NonHDL: 168.42
TRIGLYCERIDES: 269 mg/dL — AB (ref 0.0–149.0)
Total CHOL/HDL Ratio: 5
VLDL: 53.8 mg/dL — AB (ref 0.0–40.0)

## 2016-06-08 LAB — LDL CHOLESTEROL, DIRECT: Direct LDL: 130 mg/dL

## 2016-06-08 LAB — TSH: TSH: 1.46 u[IU]/mL (ref 0.35–4.50)

## 2016-06-12 ENCOUNTER — Encounter: Payer: 59 | Admitting: Family Medicine

## 2016-06-12 ENCOUNTER — Encounter: Payer: Self-pay | Admitting: Family Medicine

## 2016-06-12 NOTE — Progress Notes (Signed)
Pre visit review using our clinic review tool, if applicable. No additional management support is needed unless otherwise documented below in the visit note. 

## 2016-06-13 NOTE — Progress Notes (Signed)
This encounter was created in error - please disregard.

## 2016-06-14 ENCOUNTER — Ambulatory Visit (INDEPENDENT_AMBULATORY_CARE_PROVIDER_SITE_OTHER): Payer: 59 | Admitting: Family Medicine

## 2016-06-14 ENCOUNTER — Encounter: Payer: Self-pay | Admitting: Family Medicine

## 2016-06-14 VITALS — BP 110/90 | HR 95 | Temp 98.0°F | Ht 70.0 in | Wt 271.0 lb

## 2016-06-14 DIAGNOSIS — E8881 Metabolic syndrome: Secondary | ICD-10-CM

## 2016-06-14 DIAGNOSIS — Z0000 Encounter for general adult medical examination without abnormal findings: Secondary | ICD-10-CM | POA: Diagnosis not present

## 2016-06-14 NOTE — Progress Notes (Signed)
Pre visit review using our clinic review tool, if applicable. No additional management support is needed unless otherwise documented below in the visit note. 

## 2016-06-14 NOTE — Progress Notes (Signed)
Subjective:    Patient ID: Shane Brewer, male    DOB: 06/26/1976, 40 y.o.   MRN: [id number]  HPI  Patient seen for physical exam. He has history of obesity, metabolic syndrome, GERD, and migraine headaches. He had recent work injury several months ago and reportedly has disc herniation affecting right L5-S1 nerve root. He is waiting for surgery on that. That has limited his activity. He has lost substantial weight since last year due to his efforts. Nonsmoker. No regular alcohol use. Tetanus up-to-date.  Past Medical History  Diagnosis Date  . Arthritis   . GERD (gastroesophageal reflux disease)   . Migraine    Past Surgical History  Procedure Laterality Date  . Wrist surgery  2000    right wrist injury    reports that he has never smoked. He does not have any smokeless tobacco history on file. He reports that he drinks alcohol. He reports that he does not use illicit drugs. family history includes Alcohol abuse in his maternal uncle and paternal grandfather; Arthritis in his father and mother; Cancer in his paternal grandmother; Diabetes in his maternal grandmother; Heart disease in his maternal grandmother and paternal grandfather; Hypertension in his father, maternal grandmother, and paternal grandfather; Stroke in his maternal grandmother. Allergies  Allergen Reactions  . Strawberry Extract      Review of Systems  Constitutional: Negative for fever, activity change, appetite change and fatigue.  HENT: Negative for congestion, ear pain and trouble swallowing.   Eyes: Negative for pain and visual disturbance.  Respiratory: Negative for cough, shortness of breath and wheezing.   Cardiovascular: Negative for chest pain and palpitations.  Gastrointestinal: Negative for nausea, vomiting, abdominal pain, diarrhea, constipation, blood in stool, abdominal distention and rectal pain.  Endocrine: Negative for polydipsia and polyuria.  Genitourinary: Negative for dysuria, hematuria  and testicular pain.  Musculoskeletal: Positive for back pain. Negative for joint swelling and arthralgias.  Skin: Negative for rash.  Neurological: Negative for dizziness, syncope, weakness and headaches.  Hematological: Negative for adenopathy.  Psychiatric/Behavioral: Negative for confusion and dysphoric mood.       Objective:   Physical Exam  Constitutional: He is oriented to person, place, and time. He appears well-developed and well-nourished. No distress.  HENT:  Head: Normocephalic and atraumatic.  Right Ear: External ear normal.  Left Ear: External ear normal.  Mouth/Throat: Oropharynx is clear and moist.  Eyes: Conjunctivae and EOM are normal. Pupils are equal, round, and reactive to light.  Neck: Normal range of motion. Neck supple. No thyromegaly present.  Cardiovascular: Normal rate, regular rhythm and normal heart sounds.   No murmur heard. Pulmonary/Chest: Effort normal and breath sounds normal. No respiratory distress. He has no wheezes. He has no rales.  Abdominal: Soft. Bowel sounds are normal. He exhibits no distension and no mass. There is no tenderness. There is no rebound and no guarding.  Musculoskeletal: He exhibits no edema.  Lymphadenopathy:    He has no cervical adenopathy.  Neurological: He is alert and oriented to person, place, and time. He displays normal reflexes. No cranial nerve deficit.  Skin: No rash noted.  Psychiatric: He has a normal mood and affect.          Assessment & Plan:  Physical exam. Patient congratulated on weight loss. Tetanus up-to-date. Labs reviewed. Blood sugars now normal after his weight loss efforts. Still has high triglycerides and low HDL. We discussed dietary management of hypertriglyceridemia. Follow-up in one year and sooner as needed  Shane Brewer  Dan Europe MD [facility] at [facility]

## 2016-06-14 NOTE — Patient Instructions (Signed)

## 2016-08-03 ENCOUNTER — Other Ambulatory Visit: Payer: Self-pay | Admitting: Orthopedic Surgery

## 2016-08-07 ENCOUNTER — Other Ambulatory Visit: Payer: Self-pay | Admitting: Orthopedic Surgery

## 2016-08-09 ENCOUNTER — Ambulatory Visit (HOSPITAL_COMMUNITY)
Admission: RE | Admit: 2016-08-09 | Discharge: 2016-08-09 | Disposition: A | Discharge status: Home / Self Care | Payer: Worker's Compensation | Source: Ambulatory Visit | Attending: Orthopedic Surgery | Admitting: Orthopedic Surgery

## 2016-08-09 ENCOUNTER — Encounter (HOSPITAL_COMMUNITY)
Admission: RE | Admit: 2016-08-09 | Discharge: 2016-08-09 | Disposition: A | Discharge status: Home / Self Care | Payer: Worker's Compensation | Source: Ambulatory Visit | Attending: Orthopedic Surgery | Admitting: Orthopedic Surgery

## 2016-08-09 ENCOUNTER — Encounter (HOSPITAL_COMMUNITY): Payer: Self-pay

## 2016-08-09 DIAGNOSIS — M79604 Pain in right leg: Secondary | ICD-10-CM | POA: Insufficient documentation

## 2016-08-09 DIAGNOSIS — Z0183 Encounter for blood typing: Secondary | ICD-10-CM | POA: Diagnosis not present

## 2016-08-09 DIAGNOSIS — Z01812 Encounter for preprocedural laboratory examination: Secondary | ICD-10-CM | POA: Insufficient documentation

## 2016-08-09 DIAGNOSIS — Z01818 Encounter for other preprocedural examination: Secondary | ICD-10-CM | POA: Diagnosis not present

## 2016-08-09 HISTORY — DX: Dorsalgia, unspecified: M54.9

## 2016-08-09 HISTORY — DX: Pain in unspecified joint: M25.50

## 2016-08-09 HISTORY — DX: Personal history of other diseases of the nervous system and sense organs: Z86.69

## 2016-08-09 HISTORY — DX: Other chronic pain: G89.29

## 2016-08-09 HISTORY — DX: Effusion, unspecified joint: M25.40

## 2016-08-09 LAB — CBC WITH DIFFERENTIAL/PLATELET
BASOS ABS: 0 10*3/uL (ref 0.0–0.1)
BASOS PCT: 0 %
EOS ABS: 0.2 10*3/uL (ref 0.0–0.7)
Eosinophils Relative: 3 %
HEMATOCRIT: 43.9 % (ref 39.0–52.0)
Hemoglobin: 14.7 g/dL (ref 13.0–17.0)
Lymphocytes Relative: 26 %
Lymphs Abs: 2.1 10*3/uL (ref 0.7–4.0)
MCH: 29.8 pg (ref 26.0–34.0)
MCHC: 33.5 g/dL (ref 30.0–36.0)
MCV: 88.9 fL (ref 78.0–100.0)
MONO ABS: 0.7 10*3/uL (ref 0.1–1.0)
Monocytes Relative: 9 %
NEUTROS ABS: 5.1 10*3/uL (ref 1.7–7.7)
Neutrophils Relative %: 62 %
PLATELETS: 285 10*3/uL (ref 150–400)
RBC: 4.94 MIL/uL (ref 4.22–5.81)
RDW: 12.8 % (ref 11.5–15.5)
WBC: 8.1 10*3/uL (ref 4.0–10.5)

## 2016-08-09 LAB — SURGICAL PCR SCREEN
MRSA, PCR: NEGATIVE
STAPHYLOCOCCUS AUREUS: POSITIVE — AB

## 2016-08-09 LAB — TYPE AND SCREEN
ABO/RH(D): O NEG
Antibody Screen: NEGATIVE

## 2016-08-09 LAB — COMPREHENSIVE METABOLIC PANEL
ALBUMIN: 3.9 g/dL (ref 3.5–5.0)
ALT: 21 U/L (ref 17–63)
ANION GAP: 8 (ref 5–15)
AST: 20 U/L (ref 15–41)
Alkaline Phosphatase: 53 U/L (ref 38–126)
BILIRUBIN TOTAL: 0.7 mg/dL (ref 0.3–1.2)
BUN: 15 mg/dL (ref 6–20)
CHLORIDE: 108 mmol/L (ref 101–111)
CO2: 23 mmol/L (ref 22–32)
Calcium: 9.1 mg/dL (ref 8.9–10.3)
Creatinine, Ser: 0.72 mg/dL (ref 0.61–1.24)
GFR calc Af Amer: >60 mL/min (ref >60)
GFR calc non Af Amer: >60 mL/min (ref >60)
GLUCOSE: 112 mg/dL — AB (ref 65–99)
POTASSIUM: 4 mmol/L (ref 3.5–5.1)
SODIUM: 139 mmol/L (ref 135–145)
Total Protein: 6.7 g/dL (ref 6.5–8.1)

## 2016-08-09 LAB — URINALYSIS, ROUTINE W REFLEX MICROSCOPIC
Bilirubin Urine: NEGATIVE
GLUCOSE, UA: NEGATIVE mg/dL
HGB URINE DIPSTICK: NEGATIVE
Ketones, ur: NEGATIVE mg/dL
LEUKOCYTES UA: NEGATIVE
Nitrite: NEGATIVE
Protein, ur: NEGATIVE mg/dL
SPECIFIC GRAVITY, URINE: 1.02 (ref 1.005–1.030)
pH: 6.5 (ref 5.0–8.0)

## 2016-08-09 LAB — PROTIME-INR
INR: 0.92
Prothrombin Time: 12.3 seconds (ref 11.4–15.2)

## 2016-08-09 LAB — APTT: APTT: 30 s (ref 24–36)

## 2016-08-09 LAB — ABO/RH: ABO/RH(D): O NEG

## 2016-08-09 MED ORDER — POVIDONE-IODINE 7.5 % EX SOLN: Freq: Once | CUTANEOUS | Status: DC

## 2016-08-09 NOTE — Pre-Procedure Instructions (Signed)
PRATHER LINENBERGER  08/09/2016      [facility] [address], [place] [id number] [place] [address] Phone: [phone] Fax: [place], [place], [place] - [address] [place] [address] Phone: [phone] Fax: [phone]    Your procedure is scheduled on Thurs, Aug 31 @ 7:30 AM  Report to [facility] Admitting at 5:30 AM  Call this number if you have problems the morning of surgery:  [phone]   Remember:  Do not eat food or drink liquids after midnight.  Take these medicines the morning of surgery with A SIP OF WATER Pain Pill(if needed)               Stop taking your Ibuprofen. No Goody's,BC's,Aleve,Advil,Motrin,Fish Oil,or any Herbal Medications.    Do not wear jewelry.  Do not wear lotions, powders, or colognes.             Men may shave face and neck.  Do not bring valuables to the hospital.  [facility] is not responsible for any belongings or valuables.  Contacts, dentures or bridgework may not be worn into surgery.  Leave your suitcase in the car.  After surgery it may be brought to your room.  For patients admitted to the hospital, discharge time will be determined by your treatment team.  Patients discharged the day of surgery will not be allowed to drive home.    [facility]  Before surgery, you can play an important role.  Because skin is not sterile, your skin needs to be as free of germs as possible.  You can reduce the number of germs on you skin by washing with CHG (chlorahexidine gluconate) soap before surgery.  CHG is an antiseptic cleaner which kills germs and bonds with the skin to continue killing germs even after washing.  Please DO NOT use if you have an allergy to CHG or antibacterial soaps.  If your skin becomes reddened/irritated stop using the CHG and inform your nurse when you arrive at  Short Stay.  Do not shave (including legs and underarms) for at least 48 hours prior to the first CHG shower.  You may shave your face.  Please follow these instructions carefully:   1.  Shower with CHG Soap the night before surgery and the                                morning of Surgery.  2.  If you choose to wash your hair, wash your hair first as usual with your       normal shampoo.  3.  After you shampoo, rinse your hair and body thoroughly to remove the                      Shampoo.  4.  Use CHG as you would any other liquid soap.  You can apply chg directly       to the skin and wash gently with scrungie or a clean washcloth.  5.  Apply the CHG Soap to your body ONLY FROM THE NECK DOWN.        Do not use on open wounds or open sores.  Avoid contact with your eyes,       ears, mouth and genitals (private parts).  Wash  genitals (private parts)       with your normal soap.  6.  Wash thoroughly, paying special attention to the area where your surgery        will be performed.  7.  Thoroughly rinse your body with warm water from the neck down.  8.  DO NOT shower/wash with your normal soap after using and rinsing off       the CHG Soap.  9.  Pat yourself dry with a clean towel.            10.  Wear clean pajamas.            11.  Place clean sheets on your bed the night of your first shower and do not        sleep with pets.  Day of Surgery  Do not apply any lotions/deoderants the morning of surgery.  Please wear clean clothes to the hospital/surgery center.    Please read over the following fact sheets that you were given. Pain Booklet, Coughing and Deep Breathing, MRSA Information and Surgical Site Infection Prevention

## 2016-08-09 NOTE — Progress Notes (Signed)
[facility]cin [facility] the [facility] [address]. Left message on pts voicemail that this was called in.

## 2016-08-09 NOTE — Progress Notes (Addendum)
Cardiologist denies  Medical Md is Dr.Bruce Burchette  Echo denies  Stress test denies  Heart cath denies  EKG denies in past yr  CXR denies in past yr

## 2016-08-15 NOTE — H&P (Signed)
     PREOPERATIVE H&P  Chief Complaint: Low back pain, right leg pain  HPI: Shane Brewer is a 40 y.o. 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 Chronic back pain    slipped disc and stenosis  . History of migraine 6 yrs ago  . Joint pain   . Joint swelling    Past Surgical History:  Procedure Laterality Date  . WRIST SURGERY Right 2000   screw   Social History   Social History  . Marital status: Single    Spouse name: N/A  . Number of children: N/A  . Years of education: N/A   Social History Main Topics  . Smoking status: Never Smoker  . Smokeless tobacco: Never Used  . Alcohol use No  . Drug use: No  . Sexual activity: Not on file   Other Topics Concern  . Not on file   Social History Narrative  . No narrative on file   Family History  Problem Relation Age of Onset  . Arthritis Mother   . Arthritis Father   . Hypertension Father   . Alcohol abuse Maternal Uncle   . Heart disease Maternal Grandmother   . Hypertension Maternal Grandmother   . Stroke Maternal Grandmother   . Diabetes Maternal Grandmother   . Heart disease Paternal Grandfather   . Hypertension Paternal Grandfather   . Alcohol abuse Paternal Grandfather   . Cancer Paternal Grandmother     gastric cancer   Allergies  Allergen Reactions  . Strawberry Extract    Prior to Admission medications   Medication Sig Start Date End Date Taking? Authorizing Provider  HYDROcodone-acetaminophen (NORCO/VICODIN) 5-325 MG tablet Take 1 tablet by mouth every 6 (six) hours as needed for moderate pain.   Yes Historical Provider, MD  Multiple Vitamin (MULTIVITAMIN WITH  MINERALS) TABS Take 1 tablet by mouth daily.   Yes Historical Provider, MD  Ibuprofen-Famotidine 800-26.6 MG TABS Take 1 tablet three times daily as needed. Patient not taking: Reported on 08/04/2016 04/09/15   Lyndal Pulley, DO     All other systems have been reviewed and were otherwise negative with the exception of those mentioned in the HPI and as above.  Physical Exam: There were no vitals filed for this visit.  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

## 2016-08-16 MED ORDER — DEXTROSE 5 % IV SOLN
3.0000 g | INTRAVENOUS | Status: AC
  Administered 2016-08-17: 3 g via INTRAVENOUS
  Filled 2016-08-16: qty 3000

## 2016-08-16 NOTE — Anesthesia Preprocedure Evaluation (Signed)
Anesthesia Evaluation  Patient identified by MRN, date of birth, ID band Patient awake    Reviewed: Allergy & Precautions, H&P , Patient's Chart, lab work & pertinent test results, reviewed documented beta blocker date and time   Airway Mallampati: II  TM Distance: >3 FB Neck ROM: full    Dental no notable dental hx.    Pulmonary    Pulmonary exam normal breath sounds clear to auscultation       Cardiovascular  Rhythm:regular Rate:Normal     Neuro/Psych    GI/Hepatic   Endo/Other    Renal/GU      Musculoskeletal   Abdominal   Peds  Hematology   Anesthesia Other Findings   Reproductive/Obstetrics                             Anesthesia Physical Anesthesia Plan  ASA: III  Anesthesia Plan: General   Post-op Pain Management:    Induction: Intravenous  Airway Management Planned: Oral ETT  Additional Equipment:   Intra-op Plan:   Post-operative Plan: Extubation in OR  Informed Consent: I have reviewed the patients History and Physical, chart, labs and discussed the procedure including the risks, benefits and alternatives for the proposed anesthesia with the patient or authorized representative who has indicated his/her understanding and acceptance.   Dental Advisory Given and Dental advisory given  Plan Discussed with: CRNA and Surgeon  Anesthesia Plan Comments: (  Discussed general anesthesia, including possible nausea, instrumentation of airway, sore throat,pulmonary aspiration, etc. I asked if the were any outstanding questions, or  concerns before we proceeded. )        Anesthesia Quick Evaluation

## 2016-08-17 ENCOUNTER — Inpatient Hospital Stay (HOSPITAL_COMMUNITY): Payer: Worker's Compensation

## 2016-08-17 ENCOUNTER — Observation Stay (HOSPITAL_COMMUNITY)
Admission: RE | Admit: 2016-08-17 | Discharge: 2016-08-18 | Disposition: A | Discharge status: Home / Self Care | Payer: Worker's Compensation | Source: Ambulatory Visit | Attending: Orthopedic Surgery | Admitting: Orthopedic Surgery

## 2016-08-17 ENCOUNTER — Encounter (HOSPITAL_COMMUNITY): Payer: Self-pay | Admitting: Urology

## 2016-08-17 ENCOUNTER — Ambulatory Visit (HOSPITAL_COMMUNITY)
Admission: RE | Disposition: A | Discharge status: Home / Self Care | Payer: Self-pay | Source: Ambulatory Visit | Attending: Orthopedic Surgery

## 2016-08-17 ENCOUNTER — Inpatient Hospital Stay (HOSPITAL_COMMUNITY): Payer: Worker's Compensation | Admitting: Anesthesiology

## 2016-08-17 DIAGNOSIS — M5416 Radiculopathy, lumbar region: Secondary | ICD-10-CM | POA: Diagnosis not present

## 2016-08-17 DIAGNOSIS — M4807 Spinal stenosis, lumbosacral region: Secondary | ICD-10-CM | POA: Insufficient documentation

## 2016-08-17 DIAGNOSIS — M4317 Spondylolisthesis, lumbosacral region: Secondary | ICD-10-CM | POA: Diagnosis not present

## 2016-08-17 DIAGNOSIS — Z419 Encounter for procedure for purposes other than remedying health state, unspecified: Secondary | ICD-10-CM

## 2016-08-17 DIAGNOSIS — M5410 Radiculopathy, site unspecified: Secondary | ICD-10-CM | POA: Diagnosis present

## 2016-08-17 SURGERY — POSTERIOR LUMBAR FUSION 1 LEVEL
Anesthesia: General | Site: Back | Laterality: Right

## 2016-08-17 MED ORDER — BUPIVACAINE-EPINEPHRINE (PF) 0.25% -1:200000 IJ SOLN
INTRAMUSCULAR | Status: AC
  Filled 2016-08-17: qty 30

## 2016-08-17 MED ORDER — HYDROMORPHONE HCL 1 MG/ML IJ SOLN
INTRAMUSCULAR | Status: AC
  Filled 2016-08-17: qty 1

## 2016-08-17 MED ORDER — PROPOFOL 500 MG/50ML IV EMUL
INTRAVENOUS | Status: DC | PRN
  Administered 2016-08-17: 50 ug/kg/min via INTRAVENOUS

## 2016-08-17 MED ORDER — BUPIVACAINE-EPINEPHRINE (PF) 0.25% -1:200000 IJ SOLN
INTRAMUSCULAR | Status: DC | PRN
  Administered 2016-08-17: 20 mL via PERINEURAL

## 2016-08-17 MED ORDER — HYDROMORPHONE HCL 1 MG/ML IJ SOLN
0.5000 mg | INTRAMUSCULAR | Status: DC | PRN
  Administered 2016-08-17 (×2): 0.5 mg via INTRAVENOUS

## 2016-08-17 MED ORDER — METHYLENE BLUE 0.5 % INJ SOLN
INTRAVENOUS | Status: DC | PRN
Start: 2016-08-17 — End: 2016-08-17
  Administered 2016-08-17: .1 mL via INTRADERMAL

## 2016-08-17 MED ORDER — HYDROMORPHONE HCL 1 MG/ML IJ SOLN
0.2500 mg | INTRAMUSCULAR | Status: DC | PRN
  Administered 2016-08-17 (×4): 0.5 mg via INTRAVENOUS

## 2016-08-17 MED ORDER — ONDANSETRON HCL 4 MG/2ML IJ SOLN
INTRAMUSCULAR | Status: AC
  Filled 2016-08-17: qty 2

## 2016-08-17 MED ORDER — MIDAZOLAM HCL 5 MG/5ML IJ SOLN
INTRAMUSCULAR | Status: DC | PRN
  Administered 2016-08-17: 2 mg via INTRAVENOUS

## 2016-08-17 MED ORDER — PHENYLEPHRINE HCL 10 MG/ML IJ SOLN
INTRAMUSCULAR | Status: DC | PRN
  Administered 2016-08-17: 40 ug via INTRAVENOUS
  Administered 2016-08-17 (×4): 80 ug via INTRAVENOUS

## 2016-08-17 MED ORDER — SUGAMMADEX SODIUM 200 MG/2ML IV SOLN
INTRAVENOUS | Status: DC | PRN
  Administered 2016-08-17: 200 mg via INTRAVENOUS

## 2016-08-17 MED ORDER — FLEET ENEMA 7-19 GM/118ML RE ENEM: 1.0000 | ENEMA | Freq: Once | RECTAL | Status: DC | PRN

## 2016-08-17 MED ORDER — SUGAMMADEX SODIUM 200 MG/2ML IV SOLN
INTRAVENOUS | Status: AC
  Filled 2016-08-17: qty 2

## 2016-08-17 MED ORDER — LIDOCAINE HCL (CARDIAC) 20 MG/ML IV SOLN
INTRAVENOUS | Status: DC | PRN
  Administered 2016-08-17: 100 mg via INTRAVENOUS

## 2016-08-17 MED ORDER — ZOLPIDEM TARTRATE 5 MG PO TABS: 5.0000 mg | ORAL_TABLET | Freq: Every evening | ORAL | Status: DC | PRN

## 2016-08-17 MED ORDER — MINERAL OIL LIGHT 100 % EX OIL
TOPICAL_OIL | CUTANEOUS | Status: DC | PRN
  Administered 2016-08-17: 1 via TOPICAL

## 2016-08-17 MED ORDER — THROMBIN 20000 UNITS EX KIT
PACK | CUTANEOUS | Status: AC
  Filled 2016-08-17: qty 1

## 2016-08-17 MED ORDER — PROPOFOL 10 MG/ML IV BOLUS
INTRAVENOUS | Status: AC
  Filled 2016-08-17: qty 20

## 2016-08-17 MED ORDER — ADULT MULTIVITAMIN W/MINERALS CH
1.0000 | ORAL_TABLET | Freq: Every day | ORAL | Status: DC
  Administered 2016-08-18: 1 via ORAL
  Filled 2016-08-17: qty 1

## 2016-08-17 MED ORDER — SODIUM CHLORIDE 0.9 % IV SOLN
0.1500 ug/kg/min | INTRAVENOUS | Status: DC
  Administered 2016-08-17: .05 ug/kg/min via INTRAVENOUS
  Filled 2016-08-17 (×11): qty 2000

## 2016-08-17 MED ORDER — HYDROMORPHONE HCL 1 MG/ML IJ SOLN
INTRAMUSCULAR | Status: AC
  Administered 2016-08-17: 0.5 mg via INTRAVENOUS
  Filled 2016-08-17: qty 1

## 2016-08-17 MED ORDER — ALUM & MAG HYDROXIDE-SIMETH 200-200-20 MG/5ML PO SUSP: 30.0000 mL | Freq: Four times a day (QID) | ORAL | Status: DC | PRN

## 2016-08-17 MED ORDER — THROMBIN 20000 UNITS EX SOLR
CUTANEOUS | Status: AC
  Filled 2016-08-17: qty 20000

## 2016-08-17 MED ORDER — ONDANSETRON HCL 4 MG/2ML IJ SOLN: 4.0000 mg | INTRAMUSCULAR | Status: DC | PRN

## 2016-08-17 MED ORDER — SODIUM CHLORIDE 0.9 % IV SOLN
250.0000 mL | INTRAVENOUS | Status: DC
Start: 2016-08-17 — End: 2016-08-17

## 2016-08-17 MED ORDER — SENNOSIDES-DOCUSATE SODIUM 8.6-50 MG PO TABS
1.0000 | ORAL_TABLET | Freq: Every evening | ORAL | Status: DC | PRN
  Administered 2016-08-17: 1 via ORAL
  Filled 2016-08-17: qty 1

## 2016-08-17 MED ORDER — CEFAZOLIN SODIUM 1 G IJ SOLR
INTRAMUSCULAR | Status: AC
  Filled 2016-08-17: qty 30

## 2016-08-17 MED ORDER — PROPOFOL 1000 MG/100ML IV EMUL
INTRAVENOUS | Status: AC
  Filled 2016-08-17: qty 300

## 2016-08-17 MED ORDER — METHYLENE BLUE 0.5 % INJ SOLN
INTRAVENOUS | Status: AC
  Filled 2016-08-17: qty 10

## 2016-08-17 MED ORDER — LACTATED RINGERS IV SOLN
INTRAVENOUS | Status: DC | PRN
  Administered 2016-08-17 (×2): via INTRAVENOUS

## 2016-08-17 MED ORDER — ONDANSETRON HCL 4 MG/2ML IJ SOLN
INTRAMUSCULAR | Status: DC | PRN
  Administered 2016-08-17 (×2): 4 mg via INTRAVENOUS

## 2016-08-17 MED ORDER — DIAZEPAM 5 MG PO TABS
5.0000 mg | ORAL_TABLET | Freq: Four times a day (QID) | ORAL | Status: DC | PRN
  Administered 2016-08-17 – 2016-08-18 (×3): 5 mg via ORAL
  Filled 2016-08-17 (×3): qty 1

## 2016-08-17 MED ORDER — SUCCINYLCHOLINE CHLORIDE 20 MG/ML IJ SOLN
INTRAMUSCULAR | Status: DC | PRN
  Administered 2016-08-17: 160 mg via INTRAVENOUS

## 2016-08-17 MED ORDER — BISACODYL 5 MG PO TBEC
5.0000 mg | DELAYED_RELEASE_TABLET | Freq: Every day | ORAL | Status: DC | PRN
Start: 2016-08-17 — End: 2016-08-18

## 2016-08-17 MED ORDER — BUPIVACAINE-EPINEPHRINE 0.25% -1:200000 IJ SOLN
INTRAMUSCULAR | Status: DC | PRN
  Administered 2016-08-17: 5 mL

## 2016-08-17 MED ORDER — THROMBIN 20000 UNITS EX KIT
PACK | CUTANEOUS | Status: DC | PRN
  Administered 2016-08-17: 20000 [IU] via TOPICAL

## 2016-08-17 MED ORDER — SODIUM CHLORIDE 0.9 % IV SOLN
0.0125 ug/kg/min | INTRAVENOUS | Status: AC
  Administered 2016-08-17: .125 ug/kg/min via INTRAVENOUS
  Filled 2016-08-17: qty 1000

## 2016-08-17 MED ORDER — OXYCODONE-ACETAMINOPHEN 5-325 MG PO TABS
1.0000 | ORAL_TABLET | ORAL | Status: DC | PRN
  Administered 2016-08-17 – 2016-08-18 (×6): 2 via ORAL
  Filled 2016-08-17 (×5): qty 2

## 2016-08-17 MED ORDER — CEFAZOLIN IN D5W 1 GM/50ML IV SOLN
1.0000 g | Freq: Three times a day (TID) | INTRAVENOUS | Status: AC
Start: 2016-08-17 — End: 2016-08-18
  Administered 2016-08-17 – 2016-08-18 (×2): 1 g via INTRAVENOUS
  Filled 2016-08-17 (×2): qty 50

## 2016-08-17 MED ORDER — MENTHOL 3 MG MT LOZG: 1.0000 | LOZENGE | OROMUCOSAL | Status: DC | PRN

## 2016-08-17 MED ORDER — OXYCODONE-ACETAMINOPHEN 5-325 MG PO TABS
ORAL_TABLET | ORAL | Status: AC
  Administered 2016-08-17: 2 via ORAL
  Filled 2016-08-17: qty 2

## 2016-08-17 MED ORDER — ROCURONIUM BROMIDE 10 MG/ML (PF) SYRINGE
PREFILLED_SYRINGE | INTRAVENOUS | Status: AC
  Filled 2016-08-17: qty 10

## 2016-08-17 MED ORDER — MIDAZOLAM HCL 2 MG/2ML IJ SOLN
INTRAMUSCULAR | Status: AC
  Filled 2016-08-17: qty 2

## 2016-08-17 MED ORDER — ACETAMINOPHEN 650 MG RE SUPP
650.0000 mg | RECTAL | Status: DC | PRN
Start: 2016-08-17 — End: 2016-08-18

## 2016-08-17 MED ORDER — SUCCINYLCHOLINE CHLORIDE 200 MG/10ML IV SOSY
PREFILLED_SYRINGE | INTRAVENOUS | Status: AC
  Filled 2016-08-17: qty 10

## 2016-08-17 MED ORDER — BUPIVACAINE LIPOSOME 1.3 % IJ SUSP
20.0000 mL | Freq: Once | INTRAMUSCULAR | Status: DC
  Filled 2016-08-17: qty 20

## 2016-08-17 MED ORDER — SODIUM CHLORIDE 0.9% FLUSH: 3.0000 mL | Freq: Two times a day (BID) | INTRAVENOUS | Status: DC

## 2016-08-17 MED ORDER — BUPIVACAINE LIPOSOME 1.3 % IJ SUSP
INTRAMUSCULAR | Status: DC | PRN
  Administered 2016-08-17: 20 mL

## 2016-08-17 MED ORDER — FENTANYL CITRATE (PF) 100 MCG/2ML IJ SOLN
INTRAMUSCULAR | Status: AC
  Filled 2016-08-17: qty 4

## 2016-08-17 MED ORDER — PHENOL 1.4 % MT LIQD: 1.0000 | OROMUCOSAL | Status: DC | PRN

## 2016-08-17 MED ORDER — DOCUSATE SODIUM 100 MG PO CAPS
100.0000 mg | ORAL_CAPSULE | Freq: Two times a day (BID) | ORAL | Status: DC
  Administered 2016-08-17 – 2016-08-18 (×2): 100 mg via ORAL
  Filled 2016-08-17 (×2): qty 1

## 2016-08-17 MED ORDER — SODIUM CHLORIDE 0.9 % IV SOLN
INTRAVENOUS | Status: DC
Start: 2016-08-17 — End: 2016-08-18

## 2016-08-17 MED ORDER — MORPHINE SULFATE (PF) 2 MG/ML IV SOLN
1.0000 mg | INTRAVENOUS | Status: DC | PRN
  Administered 2016-08-17: 4 mg via INTRAVENOUS
  Filled 2016-08-17: qty 2

## 2016-08-17 MED ORDER — CEFAZOLIN SODIUM 1 G IJ SOLR
INTRAMUSCULAR | Status: DC | PRN
  Administered 2016-08-17: 3 g via INTRAMUSCULAR

## 2016-08-17 MED ORDER — MINERAL OIL LIGHT 100 % EX OIL
TOPICAL_OIL | CUTANEOUS | Status: AC
  Filled 2016-08-17: qty 25

## 2016-08-17 MED ORDER — ROCURONIUM BROMIDE 100 MG/10ML IV SOLN
INTRAVENOUS | Status: DC | PRN
  Administered 2016-08-17: 20 mg via INTRAVENOUS
  Administered 2016-08-17: 10 mg via INTRAVENOUS
  Administered 2016-08-17 (×2): 20 mg via INTRAVENOUS
  Administered 2016-08-17: 50 mg via INTRAVENOUS

## 2016-08-17 MED ORDER — ARTIFICIAL TEARS OP OINT
TOPICAL_OINTMENT | OPHTHALMIC | Status: DC | PRN
  Administered 2016-08-17: 1 via OPHTHALMIC

## 2016-08-17 MED ORDER — SODIUM CHLORIDE 0.9% FLUSH: 3.0000 mL | INTRAVENOUS | Status: DC | PRN

## 2016-08-17 MED ORDER — LACTATED RINGERS IV SOLN
INTRAVENOUS | Status: DC | PRN
  Administered 2016-08-17: 08:00:00 via INTRAVENOUS

## 2016-08-17 MED ORDER — ACETAMINOPHEN 325 MG PO TABS
650.0000 mg | ORAL_TABLET | ORAL | Status: DC | PRN
Start: 2016-08-17 — End: 2016-08-18

## 2016-08-17 MED ORDER — 0.9 % SODIUM CHLORIDE (POUR BTL) OPTIME
TOPICAL | Status: DC | PRN
  Administered 2016-08-17: 1000 mL

## 2016-08-17 MED ORDER — FENTANYL CITRATE (PF) 100 MCG/2ML IJ SOLN
INTRAMUSCULAR | Status: DC | PRN
  Administered 2016-08-17: 100 ug via INTRAVENOUS
  Administered 2016-08-17 (×5): 50 ug via INTRAVENOUS

## 2016-08-17 MED ORDER — PROPOFOL 10 MG/ML IV BOLUS
INTRAVENOUS | Status: DC | PRN
  Administered 2016-08-17: 250 mg via INTRAVENOUS

## 2016-08-17 MED FILL — Heparin Sodium (Porcine) Inj 1000 Unit/ML: INTRAMUSCULAR | Qty: 30 | Status: AC

## 2016-08-17 MED FILL — Sodium Chloride IV Soln 0.9%: INTRAVENOUS | Qty: 1000 | Status: AC

## 2016-08-17 SURGICAL SUPPLY — 85 items
BENZOIN TINCTURE PRP APPL 2/3 (GAUZE/BANDAGES/DRESSINGS) ×2 IMPLANT
BIT DRILL 3.2 (BIT) ×1
BIT DRILL 65X3.2XQC STP NS (BIT) ×1 IMPLANT
BIT DRL 65X3.2XQC STP NS (BIT) ×1
BLADE SURG ROTATE 9660 (MISCELLANEOUS) IMPLANT
BUR PRESCISION 1.7 ELITE (BURR) ×2 IMPLANT
BUR ROUND PRECISION 4.0 (BURR) IMPLANT
BUR SABER RD CUTTING 3.0 (BURR) IMPLANT
CAGE BULLET CONCORDE 9X9X27 (Cage) ×2 IMPLANT
CARTRIDGE OIL MAESTRO DRILL (MISCELLANEOUS) ×1 IMPLANT
CLSR STERI-STRIP ANTIMIC 1/2X4 (GAUZE/BANDAGES/DRESSINGS) ×2 IMPLANT
CONT SPEC STER OR (MISCELLANEOUS) ×2 IMPLANT
COVER MAYO STAND STRL (DRAPES) ×4 IMPLANT
COVER SURGICAL LIGHT HANDLE (MISCELLANEOUS) ×2 IMPLANT
DIFFUSER DRILL AIR PNEUMATIC (MISCELLANEOUS) ×2 IMPLANT
DRAIN CHANNEL 15F RND FF W/TCR (WOUND CARE) IMPLANT
DRAPE C-ARM 42X72 X-RAY (DRAPES) ×2 IMPLANT
DRAPE C-ARMOR (DRAPES) IMPLANT
DRAPE POUCH INSTRU U-SHP 10X18 (DRAPES) ×2 IMPLANT
DRAPE SURG 17X23 STRL (DRAPES) ×8 IMPLANT
DURAPREP 26ML APPLICATOR (WOUND CARE) ×2 IMPLANT
ELECT BLADE 4.0 EZ CLEAN MEGAD (MISCELLANEOUS) ×2
ELECT CAUTERY BLADE 6.4 (BLADE) ×2 IMPLANT
ELECT REM PT RETURN 9FT ADLT (ELECTROSURGICAL) ×2
ELECTRODE BLDE 4.0 EZ CLN MEGD (MISCELLANEOUS) ×1 IMPLANT
ELECTRODE REM PT RTRN 9FT ADLT (ELECTROSURGICAL) ×1 IMPLANT
EVACUATOR SILICONE 100CC (DRAIN) IMPLANT
FEE INTRAOP MONITOR IMPULS NCS (MISCELLANEOUS) ×1 IMPLANT
GAUZE SPONGE 4X4 12PLY STRL (GAUZE/BANDAGES/DRESSINGS) ×2 IMPLANT
GAUZE SPONGE 4X4 16PLY XRAY LF (GAUZE/BANDAGES/DRESSINGS) ×2 IMPLANT
GLOVE BIO SURGEON STRL SZ7 (GLOVE) ×4 IMPLANT
GLOVE BIO SURGEON STRL SZ8 (GLOVE) ×2 IMPLANT
GLOVE BIOGEL PI IND STRL 7.0 (GLOVE) ×2 IMPLANT
GLOVE BIOGEL PI IND STRL 8 (GLOVE) ×1 IMPLANT
GLOVE BIOGEL PI INDICATOR 7.0 (GLOVE) ×2
GLOVE BIOGEL PI INDICATOR 8 (GLOVE) ×1
GOWN STRL REUS W/ TWL LRG LVL3 (GOWN DISPOSABLE) ×2 IMPLANT
GOWN STRL REUS W/ TWL XL LVL3 (GOWN DISPOSABLE) ×1 IMPLANT
GOWN STRL REUS W/TWL LRG LVL3 (GOWN DISPOSABLE) ×2
GOWN STRL REUS W/TWL XL LVL3 (GOWN DISPOSABLE) ×1
INTRAOP MONITOR FEE IMPULS NCS (MISCELLANEOUS) ×1
INTRAOP MONITOR FEE IMPULSE (MISCELLANEOUS) ×1
IV CATH 14GX2 1/4 (CATHETERS) ×2 IMPLANT
KIT BASIN OR (CUSTOM PROCEDURE TRAY) ×2 IMPLANT
KIT POSITION SURG JACKSON T1 (MISCELLANEOUS) ×2 IMPLANT
KIT ROOM TURNOVER OR (KITS) ×2 IMPLANT
MARKER SKIN DUAL TIP RULER LAB (MISCELLANEOUS) ×2 IMPLANT
MIX DBX 10CC 35% BONE (Bone Implant) ×2 IMPLANT
NDL SAFETY ECLIPSE 18X1.5 (NEEDLE) ×1 IMPLANT
NEEDLE 22X1 1/2 (OR ONLY) (NEEDLE) ×2 IMPLANT
NEEDLE HYPO 18GX1.5 SHARP (NEEDLE) ×1
NEEDLE HYPO 25GX1X1/2 BEV (NEEDLE) ×2 IMPLANT
NEEDLE SPNL 18GX3.5 QUINCKE PK (NEEDLE) ×4 IMPLANT
NS IRRIG 1000ML POUR BTL (IV SOLUTION) ×2 IMPLANT
OIL CARTRIDGE MAESTRO DRILL (MISCELLANEOUS) ×2
PACK LAMINECTOMY ORTHO (CUSTOM PROCEDURE TRAY) ×2 IMPLANT
PACK UNIVERSAL I (CUSTOM PROCEDURE TRAY) ×2 IMPLANT
PAD ARMBOARD 7.5X6 YLW CONV (MISCELLANEOUS) ×4 IMPLANT
PATTIES SURGICAL .5 X1 (DISPOSABLE) ×2 IMPLANT
PATTIES SURGICAL .5X1.5 (GAUZE/BANDAGES/DRESSINGS) ×2 IMPLANT
ROD PRE BENT EXP 40MM (Rod) ×4 IMPLANT
SCREW CORTICAL VIPER 7X40MM (Screw) ×4 IMPLANT
SCREW SET SINGLE INNER (Screw) ×8 IMPLANT
SCREW VIPER CORT FIX 6X35 (Screw) ×4 IMPLANT
SPONGE GAUZE 4X4 12PLY STER LF (GAUZE/BANDAGES/DRESSINGS) ×2 IMPLANT
SPONGE INTESTINAL PEANUT (DISPOSABLE) ×2 IMPLANT
SPONGE SURGIFOAM ABS GEL 100 (HEMOSTASIS) ×2 IMPLANT
STRIP CLOSURE SKIN 1/2X4 (GAUZE/BANDAGES/DRESSINGS) ×4 IMPLANT
SURGIFLO W/THROMBIN 8M KIT (HEMOSTASIS) IMPLANT
SUT MNCRL AB 4-0 PS2 18 (SUTURE) ×2 IMPLANT
SUT VIC AB 0 CT1 18XCR BRD 8 (SUTURE) ×1 IMPLANT
SUT VIC AB 0 CT1 8-18 (SUTURE) ×1
SUT VIC AB 1 CT1 18XCR BRD 8 (SUTURE) ×1 IMPLANT
SUT VIC AB 1 CT1 8-18 (SUTURE) ×1
SUT VIC AB 2-0 CT2 18 VCP726D (SUTURE) ×2 IMPLANT
SYR 20CC LL (SYRINGE) ×2 IMPLANT
SYR BULB IRRIGATION 50ML (SYRINGE) ×2 IMPLANT
SYR CONTROL 10ML LL (SYRINGE) ×4 IMPLANT
SYR TB 1ML LUER SLIP (SYRINGE) ×2 IMPLANT
TAPE CLOTH SURG 6X10 WHT LF (GAUZE/BANDAGES/DRESSINGS) ×2 IMPLANT
TOWEL OR 17X24 6PK STRL BLUE (TOWEL DISPOSABLE) ×2 IMPLANT
TOWEL OR 17X26 10 PK STRL BLUE (TOWEL DISPOSABLE) ×2 IMPLANT
TRAY FOLEY CATH 16FRSI W/METER (SET/KITS/TRAYS/PACK) ×2 IMPLANT
WATER STERILE IRR 1000ML POUR (IV SOLUTION) ×2 IMPLANT
YANKAUER SUCT BULB TIP NO VENT (SUCTIONS) ×2 IMPLANT

## 2016-08-17 NOTE — Transfer of Care (Signed)
Immediate Anesthesia Transfer of Care Note  Patient: Shane Brewer  Procedure(s) Performed: Procedure(s) with comments: Right sided lumbar 5-sacrum 1 Transforaminal lumbar interbody fusion with instrumentation and allograft (Right) - Right sided lumbar 5-sacrum 1 Transforaminal lumbar interbody fusion with instrumentation and allograft  Patient Location: PACU  Anesthesia Type:General  Level of Consciousness:  sedated, patient cooperative and responds to stimulation  Airway & Oxygen Therapy:Patient Spontanous Breathing and Patient connected to face mask oxgen  Post-op Assessment:  Report given to PACU RN and Post -op Vital signs reviewed and stable  Post vital signs:  Reviewed and stable  Last Vitals:  Vitals:   08/17/16 0604  BP: (!) 145/81  Pulse: 93  Resp: 20  Temp: [id number] C    Complications: No apparent anesthesia complications
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
## 2016-08-17 NOTE — Anesthesia Procedure Notes (Signed)
Procedure Name: Intubation Date/Time: 08/17/2016 7:37 AM Performed by: Freddie Breech Pre-anesthesia Checklist: Patient identified, Emergency Drugs available, Suction available and Patient being monitored Patient Re-evaluated:Patient Re-evaluated prior to inductionOxygen Delivery Method: Circle System Utilized Preoxygenation: Pre-oxygenation with 100% oxygen Intubation Type: IV induction Ventilation: Mask ventilation without difficulty Laryngoscope Size: Mac and 4 Grade View: Grade II Tube type: Oral Tube size: 7.5 mm Number of attempts: 1 Airway Equipment and Method: Stylet and Oral airway Placement Confirmation: ETT inserted through vocal cords under direct vision,  positive ETCO2 and breath sounds checked- equal and bilateral Secured at: 23 cm Tube secured with: Tape Dental Injury: Teeth and Oropharynx as per pre-operative assessment

## 2016-08-18 DIAGNOSIS — M5416 Radiculopathy, lumbar region: Secondary | ICD-10-CM | POA: Diagnosis not present

## 2016-08-18 MED FILL — Thrombin For Soln 20000 Unit: CUTANEOUS | Qty: 1 | Status: AC

## 2016-08-18 NOTE — Evaluation (Addendum)
Physical Therapy Evaluation and Discharge Patient Details Name: Shane Brewer MRN: [id number] DOB: 1976/02/24 Today's Date: 08/18/2016   History of Present Illness  40 y.o. male s/p L5-S1 decompression and fusion. PMH signigficant for joint pain and swelling,  chronic back pain, migraines, R wrist surgery.  Clinical Impression  Patient seen for mobility assessment and education s/p spinal surgery. Patient mobilizing well, no difficulty with stairs. Performed education [id number], mobility expectations, pain management and car transfers. No further acute PT needs. Will sign off.    Follow Up Recommendations No PT follow up    Equipment Recommendations  None recommended by PT    Recommendations for Other Services       Precautions / Restrictions Precautions Precautions: Back;Fall Precaution Booklet Issued: Yes (comment) Precaution Comments: Pt able to recall 3/3 back precautions. Reviewed handout and precautions during functional activities. Required Braces or Orthoses: Spinal Brace Spinal Brace: Thoracolumbosacral orthotic;Applied in sitting position (with thigh attachment) Restrictions Weight Bearing Restrictions: No      Mobility  Bed Mobility Overal bed mobility: Modified Independent             General bed mobility comments: Good demonstration of log roll technique with minimal cues.   Transfers Overall transfer level: Modified independent Equipment used: None Transfers: Sit to/from Stand Sit to Stand: Supervision         General transfer comment: able to perform without cues or assist, increased time to elevate to standing  Ambulation/Gait Ambulation/Gait assistance: Modified independent (Device/Increase time) Ambulation Distance (Feet): 340 Feet Assistive device: None Gait Pattern/deviations: Step-through pattern;Decreased stride length;Wide base of support Gait velocity: decreased Gait velocity interpretation: Below normal speed for  age/gender General Gait Details: slow guarded gait, no instability noted  Stairs Stairs: Yes Stairs assistance: Supervision Stair Management: No rails Number of Stairs: 5 General stair comments: VCs for technique without use of rails  Wheelchair Mobility    Modified Rankin (Stroke Patients Only)       Balance Overall balance assessment: No apparent balance deficits (not formally assessed)                                           Pertinent Vitals/Pain Pain Assessment: 0-10 Pain Score: 4  Pain Location: incision site Pain Descriptors / Indicators: Sore;Aching Pain Intervention(s): Limited activity within patient's tolerance;Monitored during session;Premedicated before session;Repositioned    Home Living Family/patient expects to be discharged to:: Private residence Living Arrangements: Parent Available Help at Discharge: Family;Available 24 hours/day Type of Home: House Home Access: Stairs to enter Entrance Stairs-Rails: None Entrance Stairs-Number of Steps: 5 Home Layout: One level Home Equipment: Hand held shower head Additional Comments: Pt will be staying with his parents while he recovers.    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[employer]     Hand Dominance   Dominant Hand: Right    Extremity/Trunk Assessment   Upper Extremity Assessment: Overall WFL for tasks assessed           Lower Extremity Assessment: Overall WFL for tasks assessed      Cervical / Trunk Assessment: Other exceptions  Communication   Communication: No difficulties  Cognition Arousal/Alertness: Awake/alert Behavior During Therapy: WFL for tasks assessed/performed Overall Cognitive Status: Within Functional Limits for tasks assessed  General Comments      Exercises        Assessment/Plan    PT Assessment Patent does not need any further PT services  PT Diagnosis  Difficulty walking;Acute pain   PT Problem List    PT Treatment Interventions     PT Goals (Current goals can be found in the Care Plan section) Acute Rehab PT Goals Patient Stated Goal: to go home PT Goal Formulation: All assessment and education complete, DC therapy    Frequency     Barriers to discharge        Co-evaluation               End of Session Equipment Utilized During Treatment: Back brace Activity Tolerance: Patient tolerated treatment well Patient left: in chair;with call bell/phone within reach Nurse Communication: Mobility status         Time: [id number] PT Time Calculation (min) (ACUTE ONLY): 17 min   Charges:   PT Evaluation $PT Eval Low Complexity: 1 Procedure     PT G CodesDuncan Dull 09/06/2016, 9:38 AM [name], PT DPT  [phone]    09-06-2016 0900  PT G-Codes **NOT FOR INPATIENT CLASS**  Functional Assessment Tool Used clinical judgement  Functional Limitation Mobility: Walking and moving around  Mobility: Walking and Moving Around Current Status [id number]) CI  Mobility: Walking and Moving Around Goal Status [phone]) CI  Mobility: Walking and Moving Around Discharge Status [phone]) CI

## 2016-08-18 NOTE — Evaluation (Signed)
Occupational Therapy Evaluation/Discharge  Patient Details Name: Shane Brewer MRN: [id number] DOB: 09-04-1976 Today's Date: 08/18/2016    History of Present Illness 40 y.o. male s/p L5-S1 decompression and fusion. PMH signigficant for joint pain and swelling,  chronic back pain, migraines, R wrist surgery.   Clinical Impression   PTA, pt was independent with ADLs and mobility. Pt currently requires min assist for seated LB ADLs and to don/doff TLSO and supervision for basic transfers. Educated pt on back precautions, brace wear protocol, compensatory strategies for ADLs including use of reacher and long-handled sponge, gradual activity progression, frequent position changes, pain/edema management, and fall prevention strategies. Pt plans to d/c home with 24/7 assistance from his parents. All education has been completed and pt has no further questions. Pt with no further acute OT needs. OT signing off. Thank you for this referral.    Follow Up Recommendations  No OT follow up;Supervision - Intermittent    Equipment Recommendations  3 in 1 bedside comode    Recommendations for Other Services       Precautions / Restrictions Precautions Precautions: Back;Fall Precaution Booklet Issued: Yes (comment) Precaution Comments: Pt able to recall 3/3 back precautions. Reviewed handout and precautions during functional activities. Required Braces or Orthoses: Spinal Brace Spinal Brace: Thoracolumbosacral orthotic;Applied in sitting position (with thigh attachment) Restrictions Weight Bearing Restrictions: No      Mobility Bed Mobility Overal bed mobility: Modified Independent             General bed mobility comments: Good demonstration of log roll technique with minimal cues.   Transfers Overall transfer level: Needs assistance Equipment used: None Transfers: Sit to/from Stand Sit to Stand: Supervision         General transfer comment: Supervision for safety. Good  demonstration of safe hand placement, no unsteadiness or LOB observed.    Balance Overall balance assessment: No apparent balance deficits (not formally assessed)                                          ADL Overall ADL's : Needs assistance/impaired     Grooming: Wash/dry hands;Wash/dry face;Supervision/safety;Standing Grooming Details (indicate cue type and reason): educated on 2 cup method for oral care and to keep all items on R side to avoid twisting Upper Body Bathing: Supervision/ safety;Standing   Lower Body Bathing: Minimal assistance;Sit to/from stand Lower Body Bathing Details (indicate cue type and reason): educated on use of long-handled sponge Upper Body Dressing : Minimal assistance;Sitting Upper Body Dressing Details (indicate cue type and reason): to don TLSO; parents can assist Lower Body Dressing: Minimal assistance;Sit to/from stand Lower Body Dressing Details (indicate cue type and reason): educated on use of reacher to don underwear/pants; parents can assist with socks and shoes Toilet Transfer: Supervision/safety;Ambulation;BSC   Toileting- Water quality scientist and Hygiene: Supervision/safety;Sit to/from stand Toileting - Clothing Manipulation Details (indicate cue type and reason): educated on use of toilet aid and wet wipes Tub/ Shower Transfer: Walk-in shower;Supervision/safety;Ambulation Tub/Shower Transfer Details (indicate cue type and reason): Advised pt to have parents supervise transfer initially; pt reports shower stall is too small for 3in1 or shower seat to fit  Functional mobility during ADLs: Supervision/safety General ADL Comments: Reviewed pain and edema management, home safety, and fall prevention strategies including throw rugs, lighting, clutter in pathways, and pets.     Vision Vision Assessment?: No apparent visual deficits  Perception     Praxis      Pertinent Vitals/Pain Pain Assessment: 0-10 Pain Score: 4  Pain  Location: incision site Pain Descriptors / Indicators: Sore;Aching Pain Intervention(s): Limited activity within patient's tolerance;Monitored during session;Premedicated before session;Repositioned     Hand Dominance Right   Extremity/Trunk Assessment Upper Extremity Assessment Upper Extremity Assessment: Overall WFL for tasks assessed   Lower Extremity Assessment Lower Extremity Assessment: Defer to PT evaluation (numbness in R foot)   Cervical / Trunk Assessment Cervical / Trunk Assessment: Other exceptions Cervical / Trunk Exceptions: s/p lumbar surgery   Communication Communication Communication: No difficulties   Cognition Arousal/Alertness: Awake/alert Behavior During Therapy: WFL for tasks assessed/performed Overall Cognitive Status: Within Functional Limits for tasks assessed                     General Comments       Exercises       Shoulder Instructions      Home Living Family/patient expects to be discharged to:: Private residence Living Arrangements: Parent Available Help at Discharge: Family;Available 24 hours/day Type of Home: House Home Access: Stairs to enter [employer] of Steps: 5 Entrance Stairs-Rails: None Home Layout: One level     Bathroom Shower/Tub: Occupational psychologist: Standard     Home Equipment: Hand held shower head   Additional Comments: Pt will be staying with his parents while he recovers.      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[employer]    OT Diagnosis: Acute pain   OT Problem List: Decreased activity tolerance;Impaired balance (sitting and/or standing);Decreased safety awareness;Decreased knowledge of use of DME or AE;Pain;Impaired sensation   OT Treatment/Interventions:      OT Goals(Current goals can be found in the care plan section) Acute Rehab OT Goals Patient Stated Goal: to go home today OT Goal Formulation:  With patient Time For Goal Achievement: 09/01/16 Potential to Achieve Goals: Good  OT Frequency:     Barriers to D/C:            Co-evaluation              End of Session Equipment Utilized During Treatment: Back brace Nurse Communication: Mobility status  Activity Tolerance: Patient tolerated treatment well Patient left: in chair;with call bell/phone within reach   Time: 0814-0839 OT Time Calculation (min): 25 min Charges:  OT General Charges $OT Visit: 1 Procedure OT Evaluation $OT Eval Low Complexity: 1 Procedure OT Treatments $Self Care/Home Management : 8-22 mins G-Codes:    Redmond Baseman, OTR/L Pager: [phone] 08/18/2016, 8:54 AM

## 2016-08-18 NOTE — Progress Notes (Signed)
Patient alert and oriented, voiding adequate amount of urine, MAE well, C/o mild pain with pain medication given prior to discharged. Script and discharged instructions given to patient and parents prior to discharged. Patient and mother stated understanding of instructions given. Patient has an appointment with Dr. Lynann Bologna in 2 weeks.

## 2016-08-18 NOTE — Progress Notes (Signed)
    Patient doing well Denies R leg pain Has been ambulating Feels well   Physical Exam: Vitals:   08/18/16 0010 08/18/16 0400  BP: (!) 143/75 137/83  Pulse: (!) 108 (!) 102  Resp: 20 18  Temp: 98.4 F (36.9 C) 98.8 F (37.1 C)    Dressing in place NVI  POD #1 s/p L5/S1 decompression and fusion, doing well  - up with PT/OT, encourage ambulation (with brace) - Percocet for pain, Valium for muscle spasms - likely d/c home today

## 2016-08-21 NOTE — Anesthesia Postprocedure Evaluation (Signed)
Anesthesia Post Note  Patient: Shane Brewer  Procedure(s) Performed: Procedure(s) (LRB): Right sided lumbar 5-sacrum 1 Transforaminal lumbar interbody fusion with instrumentation and allograft (Right)  Patient location during evaluation: PACU Anesthesia Type: General Level of consciousness: sedated Pain management: satisfactory to patient Vital Signs Assessment: post-procedure vital signs reviewed and stable Respiratory status: spontaneous breathing Cardiovascular status: stable Anesthetic complications: no    Last Vitals:  Vitals:   08/18/16 0743 08/18/16 1201  BP: 109/79 131/90  Pulse: (!) 108 (!) 110  Resp: 18 18  Temp: 37 C 37.3 C    Last Pain:  Vitals:   08/18/16 1338  TempSrc:   PainSc: [place]

## 2016-08-24 NOTE — Progress Notes (Signed)
   08/18/16 0801  OT G-codes **NOT FOR INPATIENT CLASS**  Functional Assessment Tool Used clinical judgement  Functional Limitation Self care  Self Care Current Status [phone]) CI  Self Care Goal Status [id number]) CI  Self Care Discharge Status [id number]) CI  OT General Charges  $OT Visit 1 Procedure  OT Evaluation  $OT Eval Low Complexity 1 Procedure  OT Treatments  $Self Care/Home Management  8-22 mins   Redmond Baseman, OTR/L Pager: [phone] 08/18/2016

## 2016-08-30 NOTE — Discharge Summary (Signed)
Patient ID: STEFFON TAYMAN MRN: [id number] DOB/AGE: May 06, 1976 40 y.o.  Admit date: 08/17/2016 Discharge date: 08/18/2016  Admission Diagnoses:  Active Problems:   Radiculopathy   Discharge Diagnoses:  Same  Past Medical History:  Diagnosis Date  . Chronic back pain    slipped disc and stenosis  . History of migraine 6 yrs ago  . Joint pain   . Joint swelling     Surgeries: Procedure(s): Right sided lumbar 5-sacrum 1 Transforaminal lumbar interbody fusion with instrumentation and allograft on 08/17/2016   Consultants: None  Discharged Condition: Improved  Hospital Course: POET STUTE is an 40 y.o. male who was admitted 08/17/2016 for operative treatment of radiculopathy. Patient has severe unremitting pain that affects sleep, daily activities, and work/hobbies. After pre-op clearance the patient was taken to the operating room on 08/17/2016 and underwent  Procedure(s): Right sided lumbar 5-sacrum 1 Transforaminal lumbar interbody fusion with instrumentation and allograft.    Patient was given perioperative antibiotics:  Anti-infectives    Start     Dose/Rate Route Frequency Ordered Stop   08/17/16 2000  ceFAZolin (ANCEF) IVPB 1 g/50 mL premix     1 g 100 mL/hr over 30 Minutes Intravenous Every 8 hours 08/17/16 1324 08/18/16 0352   08/17/16 0600  ceFAZolin (ANCEF) 3 g in dextrose 5 % 50 mL IVPB     3 g 130 mL/hr over 30 Minutes Intravenous On call to O.R. 08/16/16 1330 08/17/16 0757       Patient was given sequential compression devices, early ambulation to prevent DVT.  Patient benefited maximally from hospital stay and there were no complications.    Recent vital signs: BP 131/90   Pulse (!) 110   Temp 99.2 F (37.3 C)   Resp 18   Wt 125.2 kg (276 lb)   SpO2 99%   BMI 40.76 kg/m   Discharge Medications:     Medication List    TAKE these medications   multivitamin with minerals Tabs tablet Take 1 tablet by mouth daily.       Diagnostic  Studies: Dg Chest 2 View  Result Date: 08/09/2016 CLINICAL DATA:  Preop lumbar fusion. EXAM: CHEST  2 VIEW COMPARISON:  None. FINDINGS: Heart and mediastinal contours are within normal limits. No focal opacities or effusions. No acute bony abnormality. IMPRESSION: No active cardiopulmonary disease. Electronically Signed   By: Rolm Baptise M.D.   On: 08/09/2016 09:50   Dg Lumbar Spine 2-3 Views  Result Date: 08/17/2016 CLINICAL DATA:  40 year old male undergoing L5-S1 lumbar surgery. Initial encounter. EXAM: DG C-ARM 61-120 MIN; LUMBAR SPINE - 2-3 VIEW COMPARISON:  Intraoperative image 0756 hours today. FINDINGS: 2 intraoperative fluoroscopic views of the lower lumbar spine in the AP and lateral projection. Transitional anatomy, with a level of spondylolisthesis which was designated L4-L5 on the 0756 comparison today. Sequelae of posterior and interbody fusion at that level plus posterior decompression demonstrated on these images. IMPRESSION: Transitional anatomy. Sequelae of decompression plus posterior and interbody fusion demonstrated at the level of spondylolisthesis near the lumbosacral junction. Electronically Signed   By: Genevie Ann M.D.   On: 08/17/2016 13:04   Dg Lumbar Spine 1 View  Result Date: 08/17/2016 CLINICAL DATA:  Radiculopathy. EXAM: LUMBAR SPINE - 1 VIEW COMPARISON:  None. FINDINGS: Single intraoperative cross-table lateral x-ray of the lumbar spine. Grade 1 anterolisthesis of L4 on L5. Posterior metallic needle with the tip projecting just posterior to the L4 spinous process. Degenerative disc disease at L5-S1  and to a lesser extent L4-5. IMPRESSION: Posterior metallic needle with the tip projecting just posterior to the L4 spinous process. Electronically Signed   By: Kathreen Devoid   On: 08/17/2016 11:43   Dg C-arm 1-60 Min  Result Date: 08/17/2016 CLINICAL DATA:  40 year old male undergoing L5-S1 lumbar surgery. Initial encounter. EXAM: DG C-ARM 61-120 MIN; LUMBAR SPINE - 2-3 VIEW  COMPARISON:  Intraoperative image 0756 hours today. FINDINGS: 2 intraoperative fluoroscopic views of the lower lumbar spine in the AP and lateral projection. Transitional anatomy, with a level of spondylolisthesis which was designated L4-L5 on the 0756 comparison today. Sequelae of posterior and interbody fusion at that level plus posterior decompression demonstrated on these images. IMPRESSION: Transitional anatomy. Sequelae of decompression plus posterior and interbody fusion demonstrated at the level of spondylolisthesis near the lumbosacral junction. Electronically Signed   By: Genevie Ann M.D.   On: 08/17/2016 13:04    Disposition: 01-Home or Self Care   POD #1 s/p L5/S1 decompression and fusion, doing well  - up with PT/OT, encourage ambulation (with brace) - Percocet for pain, Valium for muscle spasms -Written scripts for pain signed and in chart -D/C instructions sheet printed and in chart -D/C today  -F/U in office 2 weeks   Signed: Justice Britain 08/30/2016, 3:20 PM

## 2017-09-11 ENCOUNTER — Ambulatory Visit (INDEPENDENT_AMBULATORY_CARE_PROVIDER_SITE_OTHER): Payer: 59 | Admitting: Family Medicine

## 2017-09-11 ENCOUNTER — Encounter: Payer: Self-pay | Admitting: Family Medicine

## 2017-09-11 VITALS — BP 110/80 | HR 114 | Temp 98.4°F | Wt 314.7 lb

## 2017-09-11 DIAGNOSIS — Z8249 Family history of ischemic heart disease and other diseases of the circulatory system: Secondary | ICD-10-CM

## 2017-09-11 DIAGNOSIS — E8881 Metabolic syndrome: Secondary | ICD-10-CM | POA: Diagnosis not present

## 2017-09-11 NOTE — Patient Instructions (Signed)
We will set up the echocardiogram  Try to lose some weight

## 2017-09-11 NOTE — Progress Notes (Signed)
Subjective:     Patient ID: Shane Brewer, male   DOB: [birth year], 41 y.o.   MRN: [id number]  HPI   Patient seen basically requesting echocardiogram. He states his mother has thoracic aortic aneurysm and he has a 52 year old brother who was also diagnosed with thoracic aortic aneurysm earlier this year. Apparently both of them had presented with atrial fibrillation and that led to eventual diagnosis. Apparently his brother is being followed and has not had surgery this point and mom has had surgery. Both his brother and mother were smokers. Patient does not smoke. There is no family history of abdominal aortic aneurysm or cerebral aneurysm.  Patient had back surgery year ago and chest x-ray showed no difficulties then. He has never had echocardiogram. No family history of premature CAD. He's had some brief at rest fleeting chest soreness occasionally but never any exertional symptoms. Fairly sedentary. Poor compliance with diet past year and has gained some weight.  Past Medical History:  Diagnosis Date  . Chronic back pain    slipped disc and stenosis  . History of migraine 6 yrs ago  . Joint pain   . Joint swelling    Past Surgical History:  Procedure Laterality Date  . WRIST SURGERY Right 2000   screw    reports that he has never smoked. He has never used smokeless tobacco. He reports that he does not drink alcohol or use drugs. family history includes Alcohol abuse in his maternal uncle and paternal grandfather; Arthritis in his father and mother; Cancer in his paternal grandmother; Diabetes in his maternal grandmother; Heart disease in his maternal grandmother and paternal grandfather; Hypertension in his father, maternal grandmother, and paternal grandfather; Stroke in his maternal grandmother. Allergies  Allergen Reactions  . Strawberry Extract      Review of Systems  Constitutional: Negative for fatigue.  Eyes: Negative for visual disturbance.  Respiratory: Negative for  cough, chest tightness and shortness of breath.   Cardiovascular: Negative for palpitations and leg swelling.  Neurological: Negative for dizziness, syncope, weakness, light-headedness and headaches.       Objective:   Physical Exam  Constitutional: He is oriented to person, place, and time. He appears well-developed and well-nourished.  HENT:  Right Ear: External ear normal.  Left Ear: External ear normal.  Mouth/Throat: Oropharynx is clear and moist.  Eyes: Pupils are equal, round, and reactive to light.  Neck: Neck supple. No thyromegaly present.  Cardiovascular: Normal rate and regular rhythm.   Pulmonary/Chest: Effort normal and breath sounds normal. No respiratory distress. He has no wheezes. He has no rales.  Musculoskeletal: He exhibits no edema.  Neurological: He is alert and oriented to person, place, and time.       Assessment:     #1 morbid obesity with recent weight gain in recent years  #2 family history of thoracic aortic aneurysm in brother and mother    Plan:     -We'll try to set up echocardiogram for further assessment -Strongly encouraged him to lose some weight -Flu vaccine offered and declined -set up CPE.  Eulas Post MD [facility]

## 2017-09-21 ENCOUNTER — Ambulatory Visit (HOSPITAL_COMMUNITY): Payer: 59 | Attending: Cardiovascular Disease

## 2017-09-21 ENCOUNTER — Other Ambulatory Visit: Payer: Self-pay

## 2017-09-21 DIAGNOSIS — Z8249 Family history of ischemic heart disease and other diseases of the circulatory system: Secondary | ICD-10-CM

## 2018-01-18 ENCOUNTER — Encounter: Payer: Self-pay | Admitting: Family Medicine

## 2018-01-18 ENCOUNTER — Ambulatory Visit: Payer: 59 | Admitting: Family Medicine

## 2018-01-18 VITALS — BP 110/80 | HR 105 | Temp 98.4°F | Ht 69.0 in | Wt 323.6 lb

## 2018-01-18 DIAGNOSIS — K219 Gastro-esophageal reflux disease without esophagitis: Secondary | ICD-10-CM | POA: Diagnosis not present

## 2018-01-18 MED ORDER — PANTOPRAZOLE SODIUM 40 MG PO TBEC: 40.0000 mg | DELAYED_RELEASE_TABLET | Freq: Every day | ORAL | 3 refills | Status: DC

## 2018-01-18 NOTE — Patient Instructions (Signed)
Food Choices for Gastroesophageal Reflux Disease, Adult When you have gastroesophageal reflux disease (GERD), the foods you eat and your eating habits are very important. Choosing the right foods can help ease the discomfort of GERD. Consider working with a diet and nutrition specialist (dietitian) to help you make healthy food choices. What general guidelines should I follow? Eating plan  Choose healthy foods low in fat, such as fruits, vegetables, whole grains, low-fat dairy products, and lean meat, fish, and poultry.  Eat frequent, small meals instead of three large meals each day. Eat your meals slowly, in a relaxed setting. Avoid bending over or lying down until 2-3 hours after eating.  Limit high-fat foods such as fatty meats or fried foods.  Limit your intake of oils, butter, and shortening to less than 8 teaspoons each day.  Avoid the following: ? Foods that cause symptoms. These may be different for different people. Keep a food diary to keep track of foods that cause symptoms. ? Alcohol. ? Drinking large amounts of liquid with meals. ? Eating meals during the 2-3 hours before bed.  Cook foods using methods other than frying. This may include baking, grilling, or broiling. Lifestyle   Maintain a healthy weight. Ask your health care provider what weight is healthy for you. If you need to lose weight, work with your health care provider to do so safely.  Exercise for at least 30 minutes on 5 or more days each week, or as told by your health care provider.  Avoid wearing clothes that fit tightly around your waist and chest.  Do not use any products that contain nicotine or tobacco, such as cigarettes and e-cigarettes. If you need help quitting, ask your health care provider.  Sleep with the head of your bed raised. Use a wedge under the mattress or blocks under the bed frame to raise the head of the bed. What foods are not recommended? The items listed may not be a complete  list. Talk with your dietitian about what dietary choices are best for you. Grains Pastries or quick breads with added fat. French toast. Vegetables Deep fried vegetables. French fries. Any vegetables prepared with added fat. Any vegetables that cause symptoms. For some people this may include tomatoes and tomato products, chili peppers, onions and garlic, and horseradish. Fruits Any fruits prepared with added fat. Any fruits that cause symptoms. For some people this may include citrus fruits, such as oranges, grapefruit, pineapple, and lemons. Meats and other protein foods High-fat meats, such as fatty beef or pork, hot dogs, ribs, ham, sausage, salami and bacon. Fried meat or protein, including fried fish and fried chicken. Nuts and nut butters. Dairy Whole milk and chocolate milk. Sour cream. Cream. Ice cream. Cream cheese. Milk shakes. Beverages Coffee and tea, with or without caffeine. Carbonated beverages. Sodas. Energy drinks. Fruit juice made with acidic fruits (such as orange or grapefruit). Tomato juice. Alcoholic drinks. Fats and oils Butter. Margarine. Shortening. Ghee. Sweets and desserts Chocolate and cocoa. Donuts. Seasoning and other foods Pepper. Peppermint and spearmint. Any condiments, herbs, or seasonings that cause symptoms. For some people, this may include curry, hot sauce, or vinegar-based salad dressings. Summary  When you have gastroesophageal reflux disease (GERD), food and lifestyle choices are very important to help ease the discomfort of GERD.  Eat frequent, small meals instead of three large meals each day. Eat your meals slowly, in a relaxed setting. Avoid bending over or lying down until 2-3 hours after eating.  Limit high-fat   foods such as fatty meat or fried foods. This information is not intended to replace advice given to you by your health care provider. Make sure you discuss any questions you have with your health care provider. Document Released:  12/04/2005 Document Revised: 12/05/2016 Document Reviewed: 12/05/2016 Elsevier Interactive Patient Education  2018 [employer] one daily May continue with the Zantac 150 mg daily Consider elevate head of bed about 6 inches Try to lose some weight Avoid eating within about 2-3 hours of bedtime

## 2018-01-18 NOTE — Progress Notes (Signed)
Subjective:     Patient ID: Shane Brewer, male   DOB: 10-Mar-1976, 42 y.o.   MRN: [id number]  HPI Patient seen for new problem. He states she's had about couple months now of increased frequency of burning sensation from his epigastric up toward his sternum. A couple times at night he has even waken up with sour fluid in his mouth. He notices this especially after eating spicy foods or fatty foods. He tried adding extra pillow but is not elevated head of bed. Has been taking over-the-counter Zantac 75 mg without much improvement. No PPI use.  No recent appetite change. Weight is up about 9 pounds from September. Minimal caffeine use. He has eliminated alcohol completely.  Denies any chest pain. No cough.  Past Medical History:  Diagnosis Date  . Chronic back pain    slipped disc and stenosis  . History of migraine 6 yrs ago  . Joint pain   . Joint swelling    Past Surgical History:  Procedure Laterality Date  . WRIST SURGERY Right 2000   screw    reports that  has never smoked. he has never used smokeless tobacco. He reports that he does not drink alcohol or use drugs. family history includes Alcohol abuse in his maternal uncle and paternal grandfather; Arthritis in his father and mother; Cancer in his paternal grandmother; Diabetes in his maternal grandmother; Heart disease in his maternal grandmother and paternal grandfather; Hypertension in his father, maternal grandmother, and paternal grandfather; Stroke in his maternal grandmother. Allergies  Allergen Reactions  . Strawberry Extract      Review of Systems  Constitutional: Negative for appetite change, chills, fever and unexpected weight change.  HENT: Negative for trouble swallowing.   Respiratory: Negative for shortness of breath.   Cardiovascular: Negative for chest pain.  Gastrointestinal: Positive for abdominal pain. Negative for blood in stool, constipation, diarrhea, nausea and vomiting.  Neurological: Negative for  weakness.       Objective:   Physical Exam  Constitutional: He appears well-developed and well-nourished.  HENT:  Mouth/Throat: Oropharynx is clear and moist.  Neck: Neck supple.  Cardiovascular: Normal rate and regular rhythm.  Pulmonary/Chest: Effort normal and breath sounds normal. No respiratory distress. He has no wheezes. He has no rales.  Abdominal: Soft. Bowel sounds are normal. He exhibits no distension and no mass. There is no tenderness. There is no rebound and no guarding.       Assessment:     GERD. Patient describes progressive symptoms over couple months. He does not have any red flags such as appetite loss or weight loss. No dysphagia.    Plan:     -Needs to lose some weight -Avoid eating within 2-3 hours of bedtime -Start Protonix 40 mg daily. May supplement with Zantac 150 mg as needed -Discussed dietary modification -Minimize caffeine intake -Reassess in one month and sooner as needed  Eulas Post MD [facility]

## 2020-08-27 ENCOUNTER — Other Ambulatory Visit: Payer: Self-pay

## 2020-08-27 ENCOUNTER — Ambulatory Visit: Payer: 59 | Admitting: Family Medicine

## 2020-08-27 ENCOUNTER — Encounter: Payer: Self-pay | Admitting: Family Medicine

## 2020-08-27 VITALS — BP 120/90 | HR 117 | Temp 98.6°F | Ht 69.0 in | Wt 343.0 lb

## 2020-08-27 DIAGNOSIS — L409 Psoriasis, unspecified: Secondary | ICD-10-CM | POA: Diagnosis not present

## 2020-08-27 MED ORDER — FLUOCINOLONE ACETONIDE 0.025 % EX OINT: TOPICAL_OINTMENT | Freq: Two times a day (BID) | CUTANEOUS | 2 refills | Status: DC | PRN

## 2020-08-27 NOTE — Progress Notes (Signed)
Established Patient Office Visit  Subjective:  Patient ID: Shane Brewer, male    DOB: December 29, 1975  Age: 44 y.o. MRN: [id number]  CC:  Chief Complaint  Patient presents with  . Psoriasis    HPI Shane Brewer presents for psoriasis-like rash mostly involving his right leg with a smaller patch on the left leg.  This just appeared recently.  He does have a couple of thickened dry patches on his extensor surfaces of forearms bilaterally.  He has tried some moisturizers and over-the-counter medications without any improvement.  Skin is very dry.  Mild pruritus.  Denies any involvement of the scalp, buttock, or knees.  No significant arthralgias  Past Medical History:  Diagnosis Date  . Chronic back pain    slipped disc and stenosis  . History of migraine 6 yrs ago  . Joint pain   . Joint swelling     Past Surgical History:  Procedure Laterality Date  . WRIST SURGERY Right 2000   screw    Family History  Problem Relation Age of Onset  . Arthritis Mother   . Arthritis Father   . Hypertension Father   . Alcohol abuse Maternal Uncle   . Heart disease Maternal Grandmother   . Hypertension Maternal Grandmother   . Stroke Maternal Grandmother   . Diabetes Maternal Grandmother   . Heart disease Paternal Grandfather   . Hypertension Paternal Grandfather   . Alcohol abuse Paternal Grandfather   . Cancer Paternal Grandmother        gastric cancer    Social History   Socioeconomic History  . Marital status: Single    Spouse name: Not on file  . Number of children: Not on file  . Years of education: Not on file  . Highest education level: Not on file  Occupational History  . Not on file  Tobacco Use  . Smoking status: Never Smoker  . Smokeless tobacco: Never Used  Substance and Sexual Activity  . Alcohol use: No  . Drug use: No  . Sexual activity: Not on file  Other Topics Concern  . Not on file  Social History Narrative  . Not on file   Social Determinants of  Health   Financial Resource Strain:   . Difficulty of Paying Living Expenses: Not on file  Food Insecurity:   . Worried About Charity fundraiser in the Last Year: Not on file  . Ran Out of Food in the Last Year: Not on file  Transportation Needs:   . Lack of Transportation (Medical): Not on file  . Lack of Transportation (Non-Medical): Not on file  Physical Activity:   . Days of Exercise per Week: Not on file  . Minutes of Exercise per Session: Not on file  Stress:   . Feeling of Stress : Not on file  Social Connections:   . Frequency of Communication with Friends and Family: Not on file  . Frequency of Social Gatherings with Friends and Family: Not on file  . Attends Religious Services: Not on file  . Active Member of Clubs or Organizations: Not on file  . Attends Archivist Meetings: Not on file  . Marital Status: Not on file  Intimate Partner Violence:   . Fear of Current or Ex-Partner: Not on file  . Emotionally Abused: Not on file  . Physically Abused: Not on file  . Sexually Abused: Not on file    Outpatient Medications Prior to Visit  Medication Sig Dispense  Refill  . Multiple Vitamin (MULTIVITAMIN WITH MINERALS) TABS Take 1 tablet by mouth daily.    . pantoprazole (PROTONIX) 40 MG tablet Take 1 tablet (40 mg total) by mouth daily. 30 tablet 3   No facility-administered medications prior to visit.    Allergies  Allergen Reactions  . Strawberry Extract     ROS Review of Systems  Respiratory: Negative for shortness of breath.   Cardiovascular: Negative for chest pain.  Skin: Positive for rash.      Objective:    Physical Exam Vitals reviewed.  Cardiovascular:     Rate and Rhythm: Normal rate and regular rhythm.  Skin:    Comments: Has large plaque-like rash with well-demarcated borders and dry somewhat scaly surface with a large patch right lateral leg with a very small patch left lateral leg.  He has somewhat similar patches forearms just  distal to the elbow  Neurological:     Mental Status: He is alert.     BP 120/90   Pulse (!) 117   Temp 98.6 F (37 C)   Ht 5\' 9"  (1.753 m)   Wt (!) 343 lb (155.6 kg)   SpO2 97%   BMI 50.65 kg/m  Wt Readings from Last 3 Encounters:  08/27/20 (!) 343 lb (155.6 kg)  01/18/18 (!) 323 lb 9.6 oz (146.8 kg)  09/11/17 (!) 314 lb 11.2 oz (142.7 kg)     Health Maintenance Due  Topic Date Due  . Hepatitis C Screening  Never done  . COVID-19 Vaccine (1) Never done  . HIV Screening  Never done  . INFLUENZA VACCINE  07/18/2020    There are no preventive care reminders to display for this patient.  Lab Results  Component Value Date   TSH 1.46 06/08/2016   Lab Results  Component Value Date   WBC 8.1 08/09/2016   HGB 14.7 08/09/2016   HCT 43.9 08/09/2016   MCV 88.9 08/09/2016   PLT 285 08/09/2016   Lab Results  Component Value Date   NA 139 08/09/2016   K 4.0 08/09/2016   CO2 23 08/09/2016   GLUCOSE 112 (H) 08/09/2016   BUN 15 08/09/2016   CREATININE 0.72 08/09/2016   BILITOT 0.7 08/09/2016   ALKPHOS 53 08/09/2016   AST 20 08/09/2016   ALT 21 08/09/2016   PROT 6.7 08/09/2016   ALBUMIN 3.9 08/09/2016   CALCIUM 9.1 08/09/2016   ANIONGAP 8 08/09/2016   GFR 129.19 06/08/2016   Lab Results  Component Value Date   CHOL 207 (H) 06/08/2016   Lab Results  Component Value Date   HDL 38.30 (L) 06/08/2016   Lab Results  Component Value Date   LDLCALC 119 (H) 06/04/2015   Lab Results  Component Value Date   TRIG 269.0 (H) 06/08/2016   Lab Results  Component Value Date   CHOLHDL 5 06/08/2016   No results found for: HGBA1C    Assessment & Plan:   Plaque-like rash predominately involving right leg.  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  -Recommend trial of fluocinolone ointment to use once or twice daily and if not seeing improvement in a couple weeks to be in touch.  Would have low threshold to consider dermatology referral if not improving  Meds ordered this  encounter  Medications  . fluocinolone (SYNALAR) 0.025 % ointment    Sig: Apply topically 2 (two) times daily as needed.    Dispense:  30 g    Refill:  2    Follow-up: No follow-ups on  file.    Carolann Littler, MD

## 2020-08-27 NOTE — Patient Instructions (Signed)
Psoriasis Psoriasis is a long-term (chronic) skin condition. It occurs because your immune system causes skin cells to form too quickly. As a result, too many skin cells grow and create raised, red patches (plaques) that often look silvery on your skin. Plaques may show up anywhere on your body. They can be any size or shape. Symptoms of this condition range from mild to very severe. Psoriasis cannot be passed from one person to another (is not contagious). Sometimes, the symptoms go away and then come back again. What are the causes? The cause of psoriasis is not known, but certain factors can make the condition worse. These include:  Damage or trauma to the skin, such as cuts, scrapes, sunburn, and dryness.  Not enough exposure to sunlight.  Certain medicines.  Alcohol.  Tobacco use.  Stress.  Infections caused by bacteria or viruses. What increases the risk? You are more likely to develop this condition if you:  Have a family history of psoriasis.  Are obese.  Are 46-40 years old.  Are taking certain medicines. What are the signs or symptoms? There are different types of psoriasis. You can have more than one type of psoriasis during your life. The types are:  Plaque. This is the most common.  Guttate. This is also called eruptive psoriasis.  Inverse.  Pustular.  Erythrodermic.  Sebopsoriasis.  Psoriatic arthritis. Each type of psoriasis has different symptoms.  Plaque psoriasis symptoms include red, raised plaques with a silvery-white coating (scale). These plaques may be itchy. Your nails may be pitted and crumbly or fall off.  Guttate psoriasis symptoms include small red spots that often show up on your trunk, arms, and legs. These spots may develop after you have been sick, especially with strep throat.  Inverse psoriasis symptoms include plaques in your underarm area, under your breasts, or on your genitals, groin, or buttocks.  Pustular psoriasis symptoms  include pus-filled bumps that are painful, red, and swollen on the palms of your hands or the soles of your feet. You also may feel exhausted, feverish, weak, or have no appetite.  Erythrodermic psoriasis symptoms include bright red skin that may look burned. You may have a fast heartbeat and a body temperature that is too high or too low. You may be itchy or in pain.  Sebopsoriasis symptoms include red plaques that have a greasy coating, and are often on your scalp, forehead, and face.  Psoriatic arthritis causes swollen, painful joints along with scaly skin plaques. How is this diagnosed? This condition is diagnosed based on your symptoms, family history, and a physical exam.  You may also be referred to a health care provider who specializes in skin diseases (dermatologist).  Your health care provider may remove a tissue sample (biopsy) for testing. How is this treated? There is no cure for this condition, but treatment can help manage it. Goals of treatment include:  Helping your skin heal.  Reducing itching and inflammation.  Slowing the growth of new skin cells.  Helping your immune system respond better to your skin. Treatment varies, depending on the severity of your condition. This condition may be treated by:  Creams or ointments to help with symptoms.  Ultraviolet ray exposure (light therapy or phototherapy). This may include natural sunlight or light therapy in a medical office.  Medicines (systemic therapy). These medicines can help your body better manage skin cell turnover and inflammation. Medicines may be given in the form of pills or injections. They may be used along with light  therapy or ointments. You may also get antibiotic medicines if you have an infection. Follow these instructions at home: Skin Care  Moisturize your skin as needed. Only use moisturizers that have been approved by your health care provider.  Apply cool, wet cloths (cold compresses) to the  affected areas.  Do not use a hot tub or take hot showers. Take lukewarm showers and baths.  Do not scratch your skin. Lifestyle   Do not use any products that contain nicotine or tobacco, such as cigarettes, e-cigarettes, and chewing tobacco. If you need help quitting, ask your health care provider.  Use techniques for stress reduction, such as meditation or yoga.  Maintain a healthy weight. Follow instructions from your health care provider for weight control. These may include dietary restrictions.  Get safe exposure to the sun as told by your health care provider. This may include spending regular intervals of time outdoors in sunlight. Do not get sunburned.  Consider joining a psoriasis support group. Medicines  Take or use over-the-counter and prescription medicines only as told by your health care provider.  If you were prescribed an antibiotic medicine, take it as told by your health care provider. Do not stop using the antibiotic even if you start to feel better. Alcohol use  Limit how much you use: ? 0-1 drink a day for women. ? 0-2 drinks a day for men.  Be aware of how much alcohol is in your drink. In the [place] one drink equals one 12 oz bottle of beer (355 mL), one 5 oz glass of wine (148 mL), or one 1 oz glass of hard liquor (44 mL). General instructions  Keep a journal to help track what triggers an outbreak. Try to avoid any triggers.  See a counselor if feelings of sadness, frustration, and hopelessness about your condition are interfering with your work and relationships.  Keep all follow-up visits as told by your health care provider. This is important. Contact a health care provider if:  You have a fever.  Your pain gets worse.  You have increasing redness or warmth in the affected areas.  You have new or worsening pain or stiffness in your joints.  Your nails start to break easily or pull away from the nail bed.  You feel  depressed. Summary  Psoriasis is a long-term (chronic) skin condition. Patches (plaques) may show up anywhere on your body.  There is no cure for this condition, but treatment can help manage it. Treatment varies, depending on the severity of your condition.  Keep a journal to track what triggers an outbreak. Try to avoid any triggers.  Take or use over-the-counter and prescription medicines only as told by your health care provider.  Keep all follow-up visits as told by your health care provider. This is important. This information is not intended to replace advice given to you by your health care provider. Make sure you discuss any questions you have with your health care provider. Document Revised: 10/08/2018 Document Reviewed: 10/08/2018 Elsevier Patient Education  2020 [employer]  

## 2020-09-24 ENCOUNTER — Telehealth (INDEPENDENT_AMBULATORY_CARE_PROVIDER_SITE_OTHER): Payer: 59 | Admitting: Family Medicine

## 2020-09-24 DIAGNOSIS — L400 Psoriasis vulgaris: Secondary | ICD-10-CM | POA: Diagnosis not present

## 2020-09-24 NOTE — Progress Notes (Signed)
Patient ID: OMEGA SLAGER, male   DOB: [birth year], 44 y.o.   MRN: [id number]   This visit type was conducted due to national recommendations for restrictions regarding the COVID-19 pandemic in an effort to limit this patient's exposure and mitigate transmission in our community.   Virtual Visit via Telephone Note  I connected with Myrtis Hopping on 09/24/20 at 10:45 AM EDT by telephone and verified that I am speaking with the correct person using two identifiers.   I discussed the limitations, risks, security and privacy concerns of performing an evaluation and management service by telephone and the availability of in person appointments. I also discussed with the patient that there may be a patient responsible charge related to this service. The patient expressed understanding and agreed to proceed.  Location patient: home Location provider: work or home office Participants present for the call: patient, provider Patient did not have a visit in the prior 7 days to address this/these issue(s).   History of Present Illness: Shane Brewer was seen recently with plaque psoriasis type issues.  He had a large plaque involving the right leg and also has some involving upper extremities bilaterally around the elbow region.  We recommended trial of fluocinolone ointment but has not seen any improvement whatsoever with that.  No exacerbating factors.  We discussed possible dermatology referral if not improving with the above.  Past Medical History:  Diagnosis Date  . Chronic back pain    slipped disc and stenosis  . History of migraine 6 yrs ago  . Joint pain   . Joint swelling    Past Surgical History:  Procedure Laterality Date  . WRIST SURGERY Right 2000   screw    reports that he has never smoked. He has never used smokeless tobacco. He reports that he does not drink alcohol and does not use drugs. family history includes Alcohol abuse in his maternal uncle and paternal grandfather; Arthritis in his  father and mother; Cancer in his paternal grandmother; Diabetes in his maternal grandmother; Heart disease in his maternal grandmother and paternal grandfather; Hypertension in his father, maternal grandmother, and paternal grandfather; Stroke in his maternal grandmother. Allergies  Allergen Reactions  . Strawberry Extract       Observations/Objective: Patient sounds cheerful and well on the phone. I do not appreciate any SOB. Speech and thought processing are grossly intact. 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  I discussed the assessment and treatment plan with the patient. The patient was provided an opportunity to ask questions and all were answered. The patient agreed with the plan and demonstrated an understanding of the instructions.   The patient was advised to call back or seek an in-person evaluation if the symptoms worsen or if the condition fails to improve as anticipated.  I provided 11 minutes of non-face-to-face time during this encounter.   Carolann Littler, MD

## 2021-04-19 ENCOUNTER — Ambulatory Visit (INDEPENDENT_AMBULATORY_CARE_PROVIDER_SITE_OTHER): Payer: BLUE CROSS/BLUE SHIELD | Admitting: Family Medicine

## 2021-04-19 ENCOUNTER — Encounter: Payer: Self-pay | Admitting: Family Medicine

## 2021-04-19 ENCOUNTER — Other Ambulatory Visit: Payer: Self-pay

## 2021-04-19 VITALS — BP 138/70 | HR 102 | Temp 98.2°F | Wt 350.2 lb

## 2021-04-19 DIAGNOSIS — M25531 Pain in right wrist: Secondary | ICD-10-CM | POA: Diagnosis not present

## 2021-04-19 MED ORDER — CELECOXIB 200 MG PO CAPS: 200.0000 mg | ORAL_CAPSULE | Freq: Every day | ORAL | 1 refills | Status: DC

## 2021-04-19 NOTE — Progress Notes (Signed)
Established Patient Office Visit  Subjective:  Patient ID: Shane Brewer, male    DOB: Nov 10, 1976  Age: 45 y.o. MRN: [id number]  CC:  Chief Complaint  Patient presents with  . Hand Pain    R hand, surgery in the past, states that off and on since surgery his hand will swell and is also very painful, has become worse in recent weeks    HPI Shane Brewer presents for right wrist and hand pain.  He states that about 30 years ago he had football injury and reportedly had scaphoid fracture that was not diagnosed immediately.  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  He had surgery at 1 point but has had very limited range of motion of the wrist for several years.  He has had intermittent flareups with pain and swelling in the hand and wrist.  Denies any specific recent injury.  He is taken some turmeric and tart cherry extract along with Aleve with some relief.  Has not noted any warmth or erythema.  He has a wrist brace that he wears periodically.  Past Medical History:  Diagnosis Date  . Chronic back pain    slipped disc and stenosis  . History of migraine 6 yrs ago  . Joint pain   . Joint swelling     Past Surgical History:  Procedure Laterality Date  . WRIST SURGERY Right 2000   screw    Family History  Problem Relation Age of Onset  . Arthritis Mother   . Arthritis Father   . Hypertension Father   . Alcohol abuse Maternal Uncle   . Heart disease Maternal Grandmother   . Hypertension Maternal Grandmother   . Stroke Maternal Grandmother   . Diabetes Maternal Grandmother   . Heart disease Paternal Grandfather   . Hypertension Paternal Grandfather   . Alcohol abuse Paternal Grandfather   . Cancer Paternal Grandmother        gastric cancer    Social History   Socioeconomic History  . Marital status: Single    Spouse name: Not on file  . Number of children: Not on file  . Years of education: Not on file  . Highest education level:  Not on file  Occupational History  . Not on file  Tobacco Use  . Smoking status: Never Smoker  . Smokeless tobacco: Never Used  Substance and Sexual Activity  . Alcohol use: No  . Drug use: No  . Sexual activity: Not on file  Other Topics Concern  . Not on file  Social History Narrative  . Not on file   Social Determinants of Health   Financial Resource Strain: Not on file  Food Insecurity: Not on file  Transportation Needs: Not on file  Physical Activity: Not on file  Stress: Not on file  Social Connections: Not on file  Intimate Partner Violence: Not on file    Outpatient Medications Prior to Visit  Medication Sig Dispense Refill  . fluocinolone (SYNALAR) 0.025 % ointment Apply topically 2 (two) times daily as needed. 30 g 2  . Multiple Vitamin (MULTIVITAMIN WITH MINERALS) TABS Take 1 tablet by mouth daily.     No facility-administered medications prior to visit.    Allergies  Allergen Reactions  . Strawberry Extract     ROS Review of Systems  Constitutional: Negative for chills and fever.  Musculoskeletal: Positive for arthralgias.      Objective:    Physical Exam  Vitals reviewed.  Constitutional:      Appearance: Normal appearance.  Cardiovascular:     Rate and Rhythm: Normal rate and regular rhythm.  Musculoskeletal:     Comments: Right wrist reveals very limited range of motion with flexion and extension.  He does not have any specific bony tenderness in the wrist region.  He has some mild tenderness over the thumb [facility] joint  Neurological:     Mental Status: He is alert.     BP 138/70 (BP Location: Left Arm, Patient Position: Sitting, Cuff Size: Normal)   Pulse (!) 102   Temp 98.2 F (36.8 C) (Oral)   Wt (!) 350 lb 3.2 oz (158.8 kg)   SpO2 95%   BMI 51.72 kg/m  Wt Readings from Last 3 Encounters:  04/19/21 (!) 350 lb 3.2 oz (158.8 kg)  08/27/20 (!) 343 lb (155.6 kg)  01/18/18 (!) 323 lb 9.6 oz (146.8 kg)     Health Maintenance Due   Topic Date Due  . Hepatitis C Screening  Never done  . COVID-19 Vaccine (1) Never done  . HIV Screening  Never done  . COLONOSCOPY (Pts 45-52yrs Insurance coverage will need to be confirmed)  Never done    There are no preventive care reminders to display for this patient.  Lab Results  Component Value Date   TSH 1.46 06/08/2016   Lab Results  Component Value Date   WBC 8.1 08/09/2016   HGB 14.7 08/09/2016   HCT 43.9 08/09/2016   MCV 88.9 08/09/2016   PLT 285 08/09/2016   Lab Results  Component Value Date   NA 139 08/09/2016   K 4.0 08/09/2016   CO2 23 08/09/2016   GLUCOSE 112 (H) 08/09/2016   BUN 15 08/09/2016   CREATININE 0.72 08/09/2016   BILITOT 0.7 08/09/2016   ALKPHOS 53 08/09/2016   AST 20 08/09/2016   ALT 21 08/09/2016   PROT 6.7 08/09/2016   ALBUMIN 3.9 08/09/2016   CALCIUM 9.1 08/09/2016   ANIONGAP 8 08/09/2016   GFR 129.19 06/08/2016   Lab Results  Component Value Date   CHOL 207 (H) 06/08/2016   Lab Results  Component Value Date   HDL 38.30 (L) 06/08/2016   Lab Results  Component Value Date   LDLCALC 119 (H) 06/04/2015   Lab Results  Component Value Date   TRIG 269.0 (H) 06/08/2016   Lab Results  Component Value Date   CHOLHDL 5 06/08/2016   No results found for: HGBA1C    Assessment & Plan:   Problem List Items Addressed This Visit   None   Visit Diagnoses    Right wrist pain    -  Primary    Patient denies recent injury.  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  Meds ordered this encounter  Medications  . celecoxib (CELEBREX) 200 MG capsule    Sig: Take 1 capsule (200 mg total) by mouth daily.    Dispense:  30 capsule    Refill:  1     Follow-up: No follow-ups on file.    Carolann Littler, MD

## 2021-05-04 ENCOUNTER — Telehealth: Payer: Self-pay | Admitting: Family Medicine

## 2021-05-04 DIAGNOSIS — M25531 Pain in right wrist: Secondary | ICD-10-CM

## 2021-05-04 NOTE — Telephone Encounter (Signed)
Pt is calling in needing a referral to a hand specialist for the R hand that he was seen in the office for about a week ago by Dr. Elease Hashimoto.  Pt would like to have a call back.

## 2021-05-04 NOTE — Telephone Encounter (Signed)
Okay to place? 

## 2021-05-04 NOTE — Telephone Encounter (Signed)
Spoke with pt, he is aware order is placed & they will call him to schedule the appointment.

## 2021-05-04 NOTE — Telephone Encounter (Signed)
Okay to go ahead and place order for referral to hand surgeon

## 2021-05-04 NOTE — Addendum Note (Signed)
Addended by: Rodrigo Ran on: 05/04/2021 03:12 PM   Modules accepted: Orders

## 2021-12-18 HISTORY — PX: WRIST SURGERY: SHX841

## 2021-12-18 HISTORY — PX: CARPAL TUNNEL RELEASE: SHX101

## 2022-03-13 ENCOUNTER — Ambulatory Visit (INDEPENDENT_AMBULATORY_CARE_PROVIDER_SITE_OTHER): Payer: BLUE CROSS/BLUE SHIELD | Admitting: Family Medicine

## 2022-03-13 ENCOUNTER — Encounter: Payer: Self-pay | Admitting: Family Medicine

## 2022-03-13 VITALS — BP 116/70 | HR 93 | Temp 97.9°F | Ht 69.0 in | Wt 324.8 lb

## 2022-03-13 DIAGNOSIS — L409 Psoriasis, unspecified: Secondary | ICD-10-CM | POA: Insufficient documentation

## 2022-03-13 DIAGNOSIS — Z1159 Encounter for screening for other viral diseases: Secondary | ICD-10-CM

## 2022-03-13 DIAGNOSIS — Z0000 Encounter for general adult medical examination without abnormal findings: Secondary | ICD-10-CM | POA: Diagnosis not present

## 2022-03-13 LAB — BASIC METABOLIC PANEL
BUN: 26 mg/dL — ABNORMAL HIGH (ref 6–23)
CO2: 32 mEq/L (ref 19–32)
Calcium: 9.7 mg/dL (ref 8.4–10.5)
Chloride: 103 mEq/L (ref 96–112)
Creatinine, Ser: 0.86 mg/dL (ref 0.40–1.50)
GFR: 102.79 mL/min (ref >60.00)
Glucose, Bld: 107 mg/dL — ABNORMAL HIGH (ref 70–99)
Potassium: 4.3 mEq/L (ref 3.5–5.1)
Sodium: 141 mEq/L (ref 135–145)

## 2022-03-13 LAB — LIPID PANEL
Cholesterol: 207 mg/dL — ABNORMAL HIGH (ref 0–200)
HDL: 49.3 mg/dL (ref >39.00)
LDL Cholesterol: 134 mg/dL — ABNORMAL HIGH (ref 0–99)
NonHDL: 157.48
Total CHOL/HDL Ratio: 4
Triglycerides: 115 mg/dL (ref 0.0–149.0)
VLDL: 23 mg/dL (ref 0.0–40.0)

## 2022-03-13 LAB — CBC WITH DIFFERENTIAL/PLATELET
Basophils Absolute: 0 10*3/uL (ref 0.0–0.1)
Basophils Relative: 0.5 % (ref 0.0–3.0)
Eosinophils Absolute: 0.2 10*3/uL (ref 0.0–0.7)
Eosinophils Relative: 2.5 % (ref 0.0–5.0)
HCT: 44.1 % (ref 39.0–52.0)
Hemoglobin: 14.6 g/dL (ref 13.0–17.0)
Lymphocytes Relative: 21.8 % (ref 12.0–46.0)
Lymphs Abs: 2.2 10*3/uL (ref 0.7–4.0)
MCHC: 33.1 g/dL (ref 30.0–36.0)
MCV: 89.3 fl (ref 78.0–100.0)
Monocytes Absolute: 1 10*3/uL (ref 0.1–1.0)
Monocytes Relative: 9.7 % (ref 3.0–12.0)
Neutro Abs: 6.5 10*3/uL (ref 1.4–7.7)
Neutrophils Relative %: 65.5 % (ref 43.0–77.0)
Platelets: 306 10*3/uL (ref 150.0–400.0)
RBC: 4.94 Mil/uL (ref 4.22–5.81)
RDW: 14.1 % (ref 11.5–15.5)
WBC: 9.9 10*3/uL (ref 4.0–10.5)

## 2022-03-13 LAB — HEPATIC FUNCTION PANEL
ALT: 20 U/L (ref 0–53)
AST: 16 U/L (ref 0–37)
Albumin: 4.4 g/dL (ref 3.5–5.2)
Alkaline Phosphatase: 61 U/L (ref 39–117)
Bilirubin, Direct: 0.1 mg/dL (ref 0.0–0.3)
Total Bilirubin: 0.5 mg/dL (ref 0.2–1.2)
Total Protein: 7.6 g/dL (ref 6.0–8.3)

## 2022-03-13 NOTE — Progress Notes (Signed)
? ?Established Patient Office Visit ? ?Subjective:  ?Patient ID: Shane Brewer, male    DOB: 1976-03-18  Age: 46 y.o. MRN: [id number] ? ?CC:  ?Chief Complaint  ?Patient presents with  ? Annual Exam  ? ? ?HPI ?Shane Brewer presents for physical exam.  He has history of psoriasis and is on Skyrizi and psoriasis has been fairly stable.  Takes no other regular medications.  Generally doing well.  He has had substantial weight gain over the past few years following back surgery.  No longer plays golf. ? ?Health maintenance reviewed: ? ?-Declines flu vaccine ?-Tetanus due 2026 ?-No history of hepatitis C screening.  Only risk factors prior tattoo ?-No history of screening colonoscopy.  His father had some type of colon polyps recently. ? ?Family history-mother and father have osteoarthritis.  Father has hypertension.  Mother reportedly had some type of aortic aneurysm.  He has a brother with history of aneurysm as well.  His mother has had atrial fibrillation.  No premature CAD.  No cancer history.  No diabetes history. ? ?Social history-he is single.  Never married.  No children.  Works as a Lobbyist.  Never smoked.  Infrequent alcohol use. ? ? ?Past Medical History:  ?Diagnosis Date  ? Chronic back pain   ? slipped disc and stenosis  ? History of migraine 6 yrs ago  ? Joint pain   ? Joint swelling   ? ? ?Past Surgical History:  ?Procedure Laterality Date  ? WRIST SURGERY Right 2000  ? screw  ? ? ?Family History  ?Problem Relation Age of Onset  ? Heart disease Mother   ?     a fib,  aortic aneurysm  ? Arthritis Mother   ? Arthritis Father   ? Hypertension Father   ? Heart disease Brother   ?     a fib  ? Alcohol abuse Maternal Uncle   ? Heart disease Maternal Grandmother   ? Hypertension Maternal Grandmother   ? Stroke Maternal Grandmother   ? Diabetes Maternal Grandmother   ? Cancer Paternal Grandmother   ?     gastric cancer  ? Heart disease Paternal Grandfather   ? Hypertension Paternal Grandfather    ? Alcohol abuse Paternal Grandfather   ? ? ?Social History  ? ?Socioeconomic History  ? Marital status: Single  ?  Spouse name: Not on file  ? Number of children: Not on file  ? Years of education: Not on file  ? Highest education level: Not on file  ?Occupational History  ? Not on file  ?Tobacco Use  ? Smoking status: Never  ? Smokeless tobacco: Never  ?Substance and Sexual Activity  ? Alcohol use: No  ? Drug use: No  ? Sexual activity: Not on file  ?Other Topics Concern  ? Not on file  ?Social History Narrative  ? Not on file  ? ?Social Determinants of Health  ? ?Financial Resource Strain: Not on file  ?Food Insecurity: Not on file  ?Transportation Needs: Not on file  ?Physical Activity: Not on file  ?Stress: Not on file  ?Social Connections: Not on file  ?Intimate Partner Violence: Not on file  ? ? ?Outpatient Medications Prior to Visit  ?Medication Sig Dispense Refill  ? celecoxib (CELEBREX) 200 MG capsule Take 1 capsule (200 mg total) by mouth daily. 30 capsule 1  ? fluocinolone (SYNALAR) 0.025 % ointment Apply topically 2 (two) times daily as needed. 30 g 2  ? Multiple Vitamin (  MULTIVITAMIN WITH MINERALS) TABS Take 1 tablet by mouth daily.    ? Risankizumab-rzaa (SKYRIZI PEN) 150 MG/ML SOAJ     ? ?No facility-administered medications prior to visit.  ? ? ?Allergies  ?Allergen Reactions  ? Strawberry Extract   ? ? ?ROS ?Review of Systems  ?Constitutional:  Negative for activity change, appetite change, fatigue and fever.  ?HENT:  Negative for congestion, ear pain and trouble swallowing.   ?Eyes:  Negative for pain and visual disturbance.  ?Respiratory:  Negative for cough, shortness of breath and wheezing.   ?Cardiovascular:  Negative for chest pain and palpitations.  ?Gastrointestinal:  Negative for abdominal distention, abdominal pain, blood in stool, constipation, diarrhea, nausea, rectal pain and vomiting.  ?Endocrine: Negative for polydipsia and polyuria.  ?Genitourinary:  Negative for dysuria, hematuria  and testicular pain.  ?Musculoskeletal:  Negative for joint swelling.  ?Skin:  Negative for rash.  ?Neurological:  Negative for dizziness, syncope and headaches.  ?Hematological:  Negative for adenopathy.  ?Psychiatric/Behavioral:  Negative for confusion and dysphoric mood.   ? ?  ?Objective:  ?  ?Physical Exam ?Constitutional:   ?   General: He is not in acute distress. ?   Appearance: He is well-developed.  ?HENT:  ?   Head: Normocephalic and atraumatic.  ?   Right Ear: External ear normal.  ?   Left Ear: External ear normal.  ?Eyes:  ?   Conjunctiva/sclera: Conjunctivae normal.  ?   Pupils: Pupils are equal, round, and reactive to light.  ?Neck:  ?   Thyroid: No thyromegaly.  ?Cardiovascular:  ?   Rate and Rhythm: Normal rate and regular rhythm.  ?   Heart sounds: Normal heart sounds. No murmur heard. ?  No gallop.  ?Pulmonary:  ?   Effort: No respiratory distress.  ?   Breath sounds: No wheezing or rales.  ?Abdominal:  ?   General: Bowel sounds are normal. There is no distension.  ?   Palpations: Abdomen is soft. There is no mass.  ?   Tenderness: There is no abdominal tenderness. There is no guarding or rebound.  ?Musculoskeletal:  ?   Cervical back: Normal range of motion and neck supple.  ?   Right lower leg: No edema.  ?   Left lower leg: No edema.  ?Lymphadenopathy:  ?   Cervical: No cervical adenopathy.  ?Skin: ?   Findings: No rash.  ?Neurological:  ?   Mental Status: He is alert and oriented to person, place, and time.  ?   Cranial Nerves: No cranial nerve deficit.  ? ? ?BP 116/70 (BP Location: Left Arm, Patient Position: Sitting, Cuff Size: Large)   Pulse 93   Temp 97.9 ?F (36.6 ?C) (Oral)   Ht '5\' 9"'$  (1.753 m)   Wt (!) 324 lb 12.8 oz (147.3 kg)   SpO2 98%   BMI 47.96 kg/m?  ?Wt Readings from Last 3 Encounters:  ?03/13/22 (!) 324 lb 12.8 oz (147.3 kg)  ?04/19/21 (!) 350 lb 3.2 oz (158.8 kg)  ?08/27/20 (!) 343 lb (155.6 kg)  ? ? ? ?Health Maintenance Due  ?Topic Date Due  ? COVID-19 Vaccine (1)  Never done  ? HIV Screening  Never done  ? Hepatitis C Screening  Never done  ? COLONOSCOPY (Pts 45-22yr Insurance coverage will need to be confirmed)  Never done  ? ? ?There are no preventive care reminders to display for this patient. ? ?Lab Results  ?Component Value Date  ? TSH 1.46 06/08/2016  ? ?  Lab Results  ?Component Value Date  ? WBC 8.1 08/09/2016  ? HGB 14.7 08/09/2016  ? HCT 43.9 08/09/2016  ? MCV 88.9 08/09/2016  ? PLT 285 08/09/2016  ? ?Lab Results  ?Component Value Date  ? NA 139 08/09/2016  ? K 4.0 08/09/2016  ? CO2 23 08/09/2016  ? GLUCOSE 112 (H) 08/09/2016  ? BUN 15 08/09/2016  ? CREATININE 0.72 08/09/2016  ? BILITOT 0.7 08/09/2016  ? ALKPHOS 53 08/09/2016  ? AST 20 08/09/2016  ? ALT 21 08/09/2016  ? PROT 6.7 08/09/2016  ? ALBUMIN 3.9 08/09/2016  ? CALCIUM 9.1 08/09/2016  ? ANIONGAP 8 08/09/2016  ? GFR 129.19 06/08/2016  ? ?Lab Results  ?Component Value Date  ? CHOL 207 (H) 06/08/2016  ? ?Lab Results  ?Component Value Date  ? HDL 38.30 (L) 06/08/2016  ? ?Lab Results  ?Component Value Date  ? LDLCALC 119 (H) 06/04/2015  ? ?Lab Results  ?Component Value Date  ? TRIG 269.0 (H) 06/08/2016  ? ?Lab Results  ?Component Value Date  ? CHOLHDL 5 06/08/2016  ? ?No results found for: HGBA1C ? ?  ?Assessment & Plan:  ? ?Problem List Items Addressed This Visit   ? ?  ? Unprioritized  ? Psoriasis  ? ?Other Visit Diagnoses   ? ? Physical exam    -  Primary  ? Relevant Orders  ? Basic metabolic panel  ? Lipid panel  ? CBC with Differential/Platelet  ? TSH  ? Hepatic function panel  ? Ambulatory referral to Gastroenterology  ? Encounter for hepatitis C screening test for low risk patient      ? Relevant Orders  ? Hep C Antibody  ? ?  ?-Obtain screening labs as above including hepatitis C antibody ?-Set up referral for colonoscopy.  We have asked that he verify with his insurance regarding coverage ?-He is encouraged to lose some weight.  He has lost some since last visit but still considerably overweight ? ? ?No  orders of the defined types were placed in this encounter. ? ? ?Follow-up: No follow-ups on file.  ? ? ?Carolann Littler, MD ?

## 2022-03-13 NOTE — Progress Notes (Signed)
He

## 2022-03-14 LAB — HEPATITIS C ANTIBODY
Hepatitis C Ab: NONREACTIVE
SIGNAL TO CUT-OFF: 0.04 (ref <1.00)

## 2022-03-14 LAB — TSH: TSH: 2.3 u[IU]/mL (ref 0.35–5.50)

## 2022-04-05 ENCOUNTER — Encounter: Payer: Self-pay | Admitting: Internal Medicine

## 2022-04-07 ENCOUNTER — Telehealth: Payer: Self-pay | Admitting: *Deleted

## 2022-04-07 NOTE — Telephone Encounter (Signed)
Attempted to call x 2 message left with call back number to return call by 5 pm today to reschedule pre-visit or upcoming procedure on 04/28/22 will be cancelled. ?

## 2022-04-07 NOTE — Telephone Encounter (Signed)
No return call received no show letter mailed and procedure for 04/28/22 cancelled. ?

## 2022-04-20 ENCOUNTER — Encounter: Payer: Self-pay | Admitting: Internal Medicine

## 2022-04-20 ENCOUNTER — Ambulatory Visit (AMBULATORY_SURGERY_CENTER): Payer: BLUE CROSS/BLUE SHIELD

## 2022-04-20 VITALS — Ht 70.0 in | Wt 320.0 lb

## 2022-04-20 DIAGNOSIS — Z1211 Encounter for screening for malignant neoplasm of colon: Secondary | ICD-10-CM

## 2022-04-20 MED ORDER — NA SULFATE-K SULFATE-MG SULF 17.5-3.13-1.6 GM/177ML PO SOLN: 1.0000 | Freq: Once | ORAL | 0 refills | Status: AC

## 2022-04-20 NOTE — Progress Notes (Signed)
No egg or soy allergy known to patient  ?No issues known to pt with past sedation with any surgeries or procedures ?Patient denies ever being told they had issues or difficulty with intubation  ?No FH of Malignant Hyperthermia ?Pt is not on diet pills ?Pt is not on home 02  ?Pt is not on blood thinners  ?Pt denies issues with constipation at this time; ?No A fib or A flutter ?NO PA's for preps discussed with pt in PV today  ?Discussed with pt there will be an out-of-pocket cost for prep and that varies from $0 to 70 + dollars - pt verbalized understanding  ?Pt instructed to use Singlecare.com or GoodRx for a price reduction on prep  ?PV completed over the phone. Pt verified name, DOB, address and insurance during PV today.  ?Pt requested to pick up instruction packet with copy of consent form to read and not return, and instructions from the office;   ?Pt encouraged to call with questions or issues.  ? ?Insurance confirmed with pt at [facility] today  ? ?

## 2022-04-28 ENCOUNTER — Encounter: Payer: Self-pay | Admitting: Internal Medicine

## 2022-04-28 ENCOUNTER — Encounter: Payer: BLUE CROSS/BLUE SHIELD | Admitting: Internal Medicine

## 2022-04-28 ENCOUNTER — Ambulatory Visit (AMBULATORY_SURGERY_CENTER): Payer: BLUE CROSS/BLUE SHIELD | Admitting: Internal Medicine

## 2022-04-28 VITALS — BP 106/63 | HR 84 | Temp 97.5°F | Resp 12 | Ht 70.0 in | Wt 324.0 lb

## 2022-04-28 DIAGNOSIS — Z1211 Encounter for screening for malignant neoplasm of colon: Secondary | ICD-10-CM | POA: Diagnosis present

## 2022-04-28 DIAGNOSIS — D122 Benign neoplasm of ascending colon: Secondary | ICD-10-CM

## 2022-04-28 DIAGNOSIS — K5140 Inflammatory polyps of colon without complications: Secondary | ICD-10-CM | POA: Diagnosis not present

## 2022-04-28 MED ORDER — SODIUM CHLORIDE 0.9 % IV SOLN: 500.0000 mL | Freq: Once | INTRAVENOUS | Status: DC

## 2022-04-28 NOTE — Progress Notes (Signed)
HISTORY OF PRESENT ILLNESS: ? ?Shane Brewer is a 46 y.o. male presents for screening colonoscopy.  No complaints ? ?REVIEW OF SYSTEMS: ? ?All non-GI ROS negative. ?Past Medical History:  ?Diagnosis Date  ? Arthritis   ? Chronic back pain   ? slipped disc and stenosis  ? History of migraine 6 yrs ago  ? Joint pain   ? Joint swelling   ? Seasonal allergies   ? ? ?Past Surgical History:  ?Procedure Laterality Date  ? CARPAL TUNNEL RELEASE Right 2023  ? SPINAL FUSION  2017  ? WRIST SURGERY Right 2000  ? screw  ? WRIST SURGERY Right 2023  ? ? ?Social History ?Shane Brewer  reports that he has never smoked. He has never used smokeless tobacco. He reports that he does not currently use alcohol. He reports that he does not use drugs. ? ?family history includes Alcohol abuse in his maternal uncle and paternal grandfather; Arthritis in his father and mother; Cancer in his paternal grandmother; Colon polyps (age of onset: 71) in his father; Diabetes in his maternal grandmother; Heart disease in his brother, maternal grandmother, mother, and paternal grandfather; Hypertension in his father, maternal grandmother, and paternal grandfather; Stroke in his maternal grandmother. ? ?Allergies  ?Allergen Reactions  ? Strawberry Extract Hives  ? Neosporin [Bacitracin-Polymyxin B] Rash  ? ? ?  ? ?PHYSICAL EXAMINATION: ? ?Vital signs: BP 117/73   Pulse 97   Temp (!) 97.5 ?F (36.4 ?C) (Temporal)   Ht '5\' 10"'$  (1.778 m)   Wt (!) 324 lb (147 kg)   SpO2 95%   BMI 46.49 kg/m?  ?General: Well-developed, well-nourished, no acute distress ?HEENT: Sclerae are anicteric, conjunctiva pink. Oral mucosa intact ?Lungs: Clear ?Heart: Regular ?Abdomen: soft, nontender, nondistended, no obvious ascites, no peritoneal signs, normal bowel sounds. No organomegaly. ?Extremities: No edema ?Psychiatric: alert and oriented x3. Cooperative  ? ? ? ?ASSESSMENT: ? ?Colon cancer screening ? ? ?PLAN: ? ?Screening colonoscopy ? ? ? ? ?  ?

## 2022-04-28 NOTE — Patient Instructions (Signed)
Please read handouts provided. ?Continue present medications. ?Await pathology results. ?Repeat colonoscopy in 1 year for screening. ? ? ?YOU HAD AN ENDOSCOPIC PROCEDURE TODAY AT [facility]:   Refer to the procedure report that was given to you for any specific questions about what was found during the examination.  If the procedure report does not answer your questions, please call your gastroenterologist to clarify.  If you requested that your care partner not be given the details of your procedure findings, then the procedure report has been included in a sealed envelope for you to review at your convenience later. ? ?YOU SHOULD EXPECT: Some feelings of bloating in the abdomen. Passage of more gas than usual.  Walking can help get rid of the air that was put into your GI tract during the procedure and reduce the bloating. If you had a lower endoscopy (such as a colonoscopy or flexible sigmoidoscopy) you may notice spotting of blood in your stool or on the toilet paper. If you underwent a bowel prep for your procedure, you may not have a normal bowel movement for a few days. ? ?Please Note:  You might notice some irritation and congestion in your nose or some drainage.  This is from the oxygen used during your procedure.  There is no need for concern and it should clear up in a day or so. ? ?SYMPTOMS TO REPORT IMMEDIATELY: ? ?Following lower endoscopy (colonoscopy or flexible sigmoidoscopy): ? Excessive amounts of blood in the stool ? Significant tenderness or worsening of abdominal pains ? Swelling of the abdomen that is new, acute ? Fever of 100?F or higher ? ?For urgent or emergent issues, a gastroenterologist can be reached at any hour by calling [phone]. ?Do not use MyChart messaging for urgent concerns.  ? ? ?DIET:  We do recommend a small meal at first, but then you may proceed to your regular diet.  Drink plenty of fluids but you should avoid alcoholic beverages for 24  hours. ? ?ACTIVITY:  You should plan to take it easy for the rest of today and you should NOT DRIVE or use heavy machinery until tomorrow (because of the sedation medicines used during the test).   ? ?FOLLOW UP: ?Our staff will call the number listed on your records 48-72 hours following your procedure to check on you and address any questions or concerns that you may have regarding the information given to you following your procedure. If we do not reach you, we will leave a message.  We will attempt to reach you two times.  During this call, we will ask if you have developed any symptoms of COVID 19. If you develop any symptoms (ie: fever, flu-like symptoms, shortness of breath, cough etc.) before then, please call [phone].  If you test positive for Covid 19 in the 2 weeks post procedure, please call and report this information to [place].   ? ?If any biopsies were taken you will be contacted by phone or by letter within the next 1-3 weeks.  Please call us at [phone] if you have not heard about the biopsies in 3 weeks.  ? ? ?SIGNATURES/CONFIDENTIALITY: ?You and/or your care partner have signed paperwork which will be entered into your electronic medical record.  These signatures attest to the fact that that the information above on your After Visit Summary has been reviewed and is understood.  Full responsibility of the confidentiality of this discharge information lies with you and/or your care-partner.  ?

## 2022-04-28 NOTE — Op Note (Signed)
[place] ?Patient Name: Shane Brewer ?Procedure Date: 04/28/2022 3:14 PM ?MRN: [id number] ?Endoscopist: Docia Chuck. Henrene Pastor , MD ?Age: 46 ?Referring MD:  ?Date of Birth: Sep 14, 1976 ?Gender: Male ?Account #: [id number] ?Procedure:                Colonoscopy with endoscopic mucosal resection of  ?                          polyp ?Indications:              Screening for colorectal malignant neoplasm ?Medicines:                Monitored Anesthesia Care ?Procedure:                Pre-Anesthesia Assessment: ?                          - Prior to the procedure, a History and Physical  ?                          was performed, and patient medications and  ?                          allergies were reviewed. The patient's tolerance of  ?                          previous anesthesia was also reviewed. The risks  ?                          and benefits of the procedure and the sedation  ?                          options and risks were discussed with the patient.  ?                          All questions were answered, and informed consent  ?                          was obtained. Prior Anticoagulants: The patient has  ?                          taken no previous anticoagulant or antiplatelet  ?                          agents. ASA Grade Assessment: II - A patient with  ?                          mild systemic disease. After reviewing the risks  ?                          and benefits, the patient was deemed in  ?                          satisfactory condition to undergo the procedure. ?  After obtaining informed consent, the colonoscope  ?                          was passed under direct vision. Throughout the  ?                          procedure, the patient's blood pressure, pulse, and  ?                          oxygen saturations were monitored continuously. The  ?                          Olympus CF-[id number] (#[id number]) Colonoscope was  ?                          introduced through the anus and  advanced to the the  ?                          cecum, identified by appendiceal orifice and  ?                          ileocecal valve. The ileocecal valve, appendiceal  ?                          orifice, and rectum were photographed. The quality  ?                          of the bowel preparation was excellent. The  ?                          colonoscopy was performed without difficulty. The  ?                          patient tolerated the procedure well. The bowel  ?                          preparation used was SUPREP via split dose  ?                          instruction. ?Scope In: 3:25:49 PM ?Scope Out: 3:53:46 PM ?Scope Withdrawal Time: 0 hours 26 minutes 13 seconds  ?Total Procedure Duration: 0 hours 27 minutes 57 seconds  ?Findings:                 A 20 mm polyp was found in the proximal ascending  ?                          colon. The polyp was sessile. The polyp was removed  ?                          using endoscopic mucosal resection with a saline  ?                          injection-lift technique (12 cc) followed by a cold  ?  snare piecemeal excision. Resection and retrieval  ?                          were complete. The polyp was located across from  ?                          the ileocecal valve. See photograph for future  ?                          reference purposes. ?                          Internal hemorrhoids were found during  ?                          retroflexion. The hemorrhoids were moderate. ?                          The exam was otherwise without abnormality on  ?                          direct and retroflexion views. ?Complications:            No immediate complications. Estimated blood loss:  ?                          None. ?Estimated Blood Loss:     Estimated blood loss: none. ?Impression:               - One 20 mm polyp in the proximal ascending colon,  ?                          removed using EMR technique. Resected and retrieved. ?                           - Internal hemorrhoids. ?                          - The examination was otherwise normal on direct  ?                          and retroflexion views. ?Recommendation:           - Repeat colonoscopy in 1 year for surveillance  ?                          (based on pathology). ?                          - Patient has a contact number available for  ?                          emergencies. The signs and symptoms of potential  ?                          delayed complications were discussed with the  ?  patient. Return to normal activities tomorrow.  ?                          Written discharge instructions were provided to the  ?                          patient. ?                          - Resume previous diet. ?                          - Continue present medications. ?                          - Await pathology results. ?Docia Chuck. Henrene Pastor, MD ?04/28/2022 4:02:46 PM ?This report has been signed electronically. ?

## 2022-04-28 NOTE — Progress Notes (Signed)
A and O x3. Report to RN. Tolerated MAC anesthesia well.  ?

## 2022-04-28 NOTE — Progress Notes (Signed)
Pt's states no medical or surgical changes since previsit or office visit. 

## 2022-04-28 NOTE — Progress Notes (Signed)
Called to room to assist during endoscopic procedure.  Patient ID and intended procedure confirmed with present staff. Received instructions for my participation in the procedure from the performing physician.  

## 2022-05-02 ENCOUNTER — Telehealth: Payer: Self-pay

## 2022-05-02 NOTE — Telephone Encounter (Signed)
?  Follow up Call- ? ? ?  04/28/2022  ?  2:12 PM  ?Call back number  ?Post procedure Call Back phone  # [phone]  ?Permission to leave phone message Yes  ?  ? ?Patient questions: ? ?Do you have a fever, pain , or abdominal swelling? No. ?Pain Score  0 * ? ?Have you tolerated food without any problems? Yes.   ? ?Have you been able to return to your normal activities? Yes.   ? ?Do you have any questions about your discharge instructions: ?Diet   No. ?Medications  No. ?Follow up visit  No. ? ?Do you have questions or concerns about your Care? No. ? ?Actions: ?* If pain score is 4 or above: ?No action needed, pain <4. ? ? ?

## 2022-05-03 ENCOUNTER — Encounter: Payer: Self-pay | Admitting: Internal Medicine

## 2023-05-31 ENCOUNTER — Encounter: Payer: Self-pay | Admitting: Internal Medicine

## 2024-01-30 ENCOUNTER — Telehealth: Payer: Self-pay | Admitting: Family Medicine

## 2024-08-26 ENCOUNTER — Ambulatory Visit: Payer: Self-pay | Admitting: Family Medicine

## 2024-08-26 ENCOUNTER — Ambulatory Visit (INDEPENDENT_AMBULATORY_CARE_PROVIDER_SITE_OTHER): Admitting: Family Medicine

## 2024-08-26 ENCOUNTER — Encounter: Payer: Self-pay | Admitting: Family Medicine

## 2024-08-26 VITALS — BP 136/80 | HR 90 | Temp 98.1°F | Ht 69.29 in | Wt 374.4 lb

## 2024-08-26 DIAGNOSIS — E785 Hyperlipidemia, unspecified: Secondary | ICD-10-CM

## 2024-08-26 DIAGNOSIS — Z125 Encounter for screening for malignant neoplasm of prostate: Secondary | ICD-10-CM

## 2024-08-26 DIAGNOSIS — Z0000 Encounter for general adult medical examination without abnormal findings: Secondary | ICD-10-CM

## 2024-08-26 DIAGNOSIS — Z23 Encounter for immunization: Secondary | ICD-10-CM | POA: Diagnosis not present

## 2024-08-26 DIAGNOSIS — Z860100 Personal history of colon polyps, unspecified: Secondary | ICD-10-CM | POA: Diagnosis not present

## 2024-08-26 LAB — CBC WITH DIFFERENTIAL/PLATELET
Basophils Absolute: 0 K/uL (ref 0.0–0.1)
Basophils Relative: 0.5 % (ref 0.0–3.0)
Eosinophils Absolute: 0.2 K/uL (ref 0.0–0.7)
Eosinophils Relative: 3.1 % (ref 0.0–5.0)
HCT: 43.9 % (ref 39.0–52.0)
Hemoglobin: 14.5 g/dL (ref 13.0–17.0)
Lymphocytes Relative: 23.4 % (ref 12.0–46.0)
Lymphs Abs: 1.8 K/uL (ref 0.7–4.0)
MCHC: 33.1 g/dL (ref 30.0–36.0)
MCV: 90.9 fl (ref 78.0–100.0)
Monocytes Absolute: 0.7 K/uL (ref 0.1–1.0)
Monocytes Relative: 9.7 % (ref 3.0–12.0)
Neutro Abs: 4.8 K/uL (ref 1.4–7.7)
Neutrophils Relative %: 63.3 % (ref 43.0–77.0)
Platelets: 285 K/uL (ref 150.0–400.0)
RBC: 4.83 Mil/uL (ref 4.22–5.81)
RDW: 14.3 % (ref 11.5–15.5)
WBC: 7.5 K/uL (ref 4.0–10.5)

## 2024-08-26 LAB — BASIC METABOLIC PANEL WITH GFR
BUN: 17 mg/dL (ref 6–23)
CO2: 31 meq/L (ref 19–32)
Calcium: 9 mg/dL (ref 8.4–10.5)
Chloride: 103 meq/L (ref 96–112)
Creatinine, Ser: 0.79 mg/dL (ref 0.40–1.50)
GFR: 103.65 mL/min (ref >60.00)
Glucose, Bld: 115 mg/dL — ABNORMAL HIGH (ref 70–99)
Potassium: 4.6 meq/L (ref 3.5–5.1)
Sodium: 140 meq/L (ref 135–145)

## 2024-08-26 LAB — LIPID PANEL
Cholesterol: 201 mg/dL — ABNORMAL HIGH (ref 0–200)
HDL: 48.6 mg/dL (ref >39.00)
LDL Cholesterol: 128 mg/dL — ABNORMAL HIGH (ref 0–99)
NonHDL: 152.33
Total CHOL/HDL Ratio: 4
Triglycerides: 122 mg/dL (ref 0.0–149.0)
VLDL: 24.4 mg/dL (ref 0.0–40.0)

## 2024-08-26 LAB — HEPATIC FUNCTION PANEL
ALT: 31 U/L (ref 0–53)
AST: 26 U/L (ref 0–37)
Albumin: 4.1 g/dL (ref 3.5–5.2)
Alkaline Phosphatase: 57 U/L (ref 39–117)
Bilirubin, Direct: 0.1 mg/dL (ref 0.0–0.3)
Total Bilirubin: 0.5 mg/dL (ref 0.2–1.2)
Total Protein: 6.9 g/dL (ref 6.0–8.3)

## 2024-08-26 LAB — PSA: PSA: 0.27 ng/mL (ref 0.10–4.00)

## 2024-08-26 LAB — HEMOGLOBIN A1C: Hgb A1c MFr Bld: 6.5 % (ref 4.6–6.5)

## 2024-08-26 NOTE — Patient Instructions (Signed)
Remember repeat Shingrix vaccine in 2 to 6 months.  ?

## 2024-08-26 NOTE — Progress Notes (Signed)
 Established Patient Office Visit  Subjective   Patient ID: Shane Brewer, male    DOB: 14-Aug-1976  Age: 48 y.o. MRN: [id number]  Chief Complaint  Patient presents with   Annual Exam    HPI   Shane Brewer is seen today for physical exam.  He just moved back here recently from [place]  where he lived for the past year.  He is currently working over at [employer].  Past medical history significant for migraine headaches, morbid obesity, metabolic syndrome, psoriasis, GERD.  He plans to reestablish with dermatologist soon.  He has previously been on Skyrizi but not getting any injections for the past year.  Starting to have more active psoriasis.  Currently takes no regular medications.  Does have suspected obstructive sleep apnea.  Never study\ied.  No significant daytime somnolence.  Has been told he snores in the past.  Health maintenance reviewed  - Had inflammatory-type polyp back in 2023 with recommended 1 year follow-up is basically 2 years overdue.  He does agree with going back to see GI at this time -Declines flu vaccine and pneumonia -Does consent to shingles vaccine and is aware that he needs booster in 2 to 6 months  Family history-maternal grandmother type 2 diabetes.  Mother and father both alive age 9.  Mom has history of atrial fibrillation and thoracic aortic aneurysm.  He also has a brother with aortic aneurysm.  Father relatively well.  Paternal grandfather had CAD age 21  Social history-he is single.  Non-smoker.  About 2-3 alcoholic beverages per week.  Works at [employer]  Past Medical History:  Diagnosis Date   Arthritis    Chronic back pain    slipped disc and stenosis   History of migraine 6 yrs ago   Joint pain    Joint swelling    Seasonal allergies    Past Surgical History:  Procedure Laterality Date   CARPAL TUNNEL RELEASE Right 2023   SPINAL FUSION  2017   WRIST SURGERY Right 2000   screw   WRIST SURGERY Right 2023    reports  that he has never smoked. He has never used smokeless tobacco. He reports that he does not currently use alcohol. He reports that he does not use drugs. family history includes Alcohol abuse in his maternal uncle and paternal grandfather; Arthritis in his father and mother; Cancer in his paternal grandmother; Colon polyps (age of onset: 17) in his father; Diabetes in his maternal grandmother; Heart disease in his brother, maternal grandmother, mother, and paternal grandfather; Hypertension in his father, maternal grandmother, and paternal grandfather; Stroke in his maternal grandmother. Allergies  Allergen Reactions   Strawberry Extract Hives   Neosporin [Bacitracin-Polymyxin B] Rash    Review of Systems  Constitutional:  Negative for chills, fever, malaise/fatigue and weight loss.  HENT:  Negative for hearing loss.   Eyes:  Negative for blurred vision and double vision.  Respiratory:  Negative for cough and shortness of breath.   Cardiovascular:  Negative for chest pain, palpitations and leg swelling.  Gastrointestinal:  Negative for abdominal pain, blood in stool, constipation and diarrhea.  Genitourinary:  Negative for dysuria.  Skin:  Positive for rash.  Neurological:  Negative for dizziness, speech change, seizures, loss of consciousness and headaches.  Psychiatric/Behavioral:  Negative for depression.       Objective:     BP 136/80   Pulse 90   Temp 98.1 F (36.7 C) (Oral)   Ht 5' 9.29 (1.76  m)   Wt (!) 374 lb 6.4 oz (169.8 kg)   SpO2 95%   BMI 54.83 kg/m  BP Readings from Last 3 Encounters:  08/26/24 136/80  04/28/22 106/63  03/13/22 116/70   Wt Readings from Last 3 Encounters:  08/26/24 (!) 374 lb 6.4 oz (169.8 kg)  04/28/22 (!) 324 lb (147 kg)  04/20/22 (!) 320 lb (145.2 kg)      Physical Exam Vitals reviewed.  Constitutional:      General: He is not in acute distress.    Appearance: He is well-developed. He is not ill-appearing.  HENT:     Head:  Normocephalic and atraumatic.     Right Ear: External ear normal.     Left Ear: External ear normal.  Eyes:     Conjunctiva/sclera: Conjunctivae normal.     Pupils: Pupils are equal, round, and reactive to light.  Neck:     Thyroid : No thyromegaly.  Cardiovascular:     Rate and Rhythm: Normal rate and regular rhythm.     Heart sounds: Normal heart sounds. No murmur heard. Pulmonary:     Effort: No respiratory distress.     Breath sounds: No wheezing or rales.  Abdominal:     General: Bowel sounds are normal. There is no distension.     Palpations: Abdomen is soft. There is no mass.     Tenderness: There is no abdominal tenderness. There is no guarding or rebound.  Musculoskeletal:     Cervical back: Normal range of motion and neck supple.  Lymphadenopathy:     Cervical: No cervical adenopathy.  Skin:    Findings: Rash present.     Comments: Several areas of classic plaque like psoriasis especially extensor surfaces  Neurological:     Mental Status: He is alert and oriented to person, place, and time.     Cranial Nerves: No cranial nerve deficit.      No results found for any visits on 08/26/24.    The 10-year ASCVD risk score (Arnett DK, et al., 2019) is: 4.1%    Assessment & Plan:   Problem List Items Addressed This Visit   None Visit Diagnoses       Physical exam    -  Primary   Relevant Orders   Basic metabolic panel with GFR   Lipid panel   CBC with Differential/Platelet   Hepatic function panel   PSA   Hemoglobin A1c     History of colon polyps       Relevant Orders   Ambulatory referral to Gastroenterology     Hyperlipidemia, unspecified hyperlipidemia type       Relevant Orders   CT CARDIAC SCORING (SELF PAY ONLY)   Lipid panel     Patient has chronic medical problems as above.  He has morbid obesity and has gained substantial weight since he was here last over 2 years ago.  History of inflammatory polyp 2023 with recommended 1 year follow-up.  We  discussed several health maintenance issues as follows  - Strongly advised weight loss - Offered flu vaccine and pneumonia vaccine he declines - Patient does express wish to start shingles vaccine and knows he needs to get booster in 2 to 6 months - Obtain labs as above - Set up referral back to GI for repeat colonoscopy which is overdue - Patient will set up follow-up with dermatologist regarding his psoriasis - Strong clinical suspicion of obstructive sleep apnea.  Consider sleep study and patient will consider  but at this point not interested - We discussed possible coronary calcium score for further risk stratification and patient would like to proceed with that  No follow-ups on file.    Wolm Scarlet, MD

## 2024-08-26 NOTE — Addendum Note (Signed)
 Addended by: METTA KRISTEN CROME on: 08/26/2024 01:54 PM   Modules accepted: Orders

## 2024-08-27 MED ORDER — METFORMIN HCL 500 MG PO TABS: 500.0000 mg | ORAL_TABLET | Freq: Every day | ORAL | 1 refills | Status: AC

## 2024-08-29 ENCOUNTER — Encounter: Payer: Self-pay | Admitting: Internal Medicine

## 2024-09-09 DIAGNOSIS — L400 Psoriasis vulgaris: Secondary | ICD-10-CM | POA: Diagnosis not present

## 2024-09-12 ENCOUNTER — Telehealth: Payer: Self-pay

## 2024-09-12 ENCOUNTER — Other Ambulatory Visit: Payer: Self-pay

## 2024-09-12 DIAGNOSIS — Z1211 Encounter for screening for malignant neoplasm of colon: Secondary | ICD-10-CM

## 2024-09-12 NOTE — Telephone Encounter (Signed)
 Pts previsit rescheduled for 09/23/24@8am . Pts colon scheduled at [facility] with Dr Abran 10/29/24@9am . [id number]. Please let pt know about appts.

## 2024-09-12 NOTE — Telephone Encounter (Signed)
 In preparing for pre-visit appt that patient currently has scheduled for 09/23/24 for an [facility] procedure, RN observed that patient BMI as of 08/26/24 is >50, which falls outside the [facility] requirements.   Patient will need to have procedure scheduled at the hospital. RN sending message to Dr. Nancyann RN regarding this information.

## 2024-09-12 NOTE — Telephone Encounter (Signed)
 See previous note

## 2024-09-15 ENCOUNTER — Ambulatory Visit (HOSPITAL_BASED_OUTPATIENT_CLINIC_OR_DEPARTMENT_OTHER)
Admission: RE | Admit: 2024-09-15 | Discharge: 2024-09-15 | Disposition: A | Discharge status: Home / Self Care | Payer: Self-pay | Source: Ambulatory Visit | Attending: Family Medicine | Admitting: Family Medicine

## 2024-09-15 DIAGNOSIS — E785 Hyperlipidemia, unspecified: Secondary | ICD-10-CM | POA: Insufficient documentation

## 2024-09-18 ENCOUNTER — Telehealth: Payer: Self-pay | Admitting: *Deleted

## 2024-09-18 NOTE — Telephone Encounter (Signed)
 Patient is already scheduled at the hospital with Dr. Abran Noted on [facility] chart

## 2024-09-18 NOTE — Telephone Encounter (Signed)
 Yesi,  This pt's BMI is greater than 50; their procedure will need to be performed at the hospital.  Thanks,  Cathlyn Parsons

## 2024-09-23 ENCOUNTER — Encounter

## 2024-09-23 ENCOUNTER — Telehealth: Payer: Self-pay

## 2024-09-23 ENCOUNTER — Ambulatory Visit (AMBULATORY_SURGERY_CENTER)

## 2024-09-23 VITALS — Ht 69.29 in | Wt 367.8 lb

## 2024-09-23 DIAGNOSIS — Z860100 Personal history of colon polyps, unspecified: Secondary | ICD-10-CM

## 2024-09-23 MED ORDER — NA SULFATE-K SULFATE-MG SULF 17.5-3.13-1.6 GM/177ML PO SOLN: 1.0000 | Freq: Once | ORAL | 0 refills | Status: AC

## 2024-09-23 NOTE — Telephone Encounter (Signed)
 Informed patient of new arrival time of 7:30 AM at [facility] for his colonoscopy on 10/29/2024. Patient verbalizes understanding. New prep instructions with updated times sent via Mychart.

## 2024-09-23 NOTE — Progress Notes (Signed)

## 2024-10-07 ENCOUNTER — Encounter: Admitting: Internal Medicine

## 2024-10-09 DIAGNOSIS — E119 Type 2 diabetes mellitus without complications: Secondary | ICD-10-CM | POA: Diagnosis not present

## 2024-10-12 DIAGNOSIS — R001 Bradycardia, unspecified: Secondary | ICD-10-CM | POA: Diagnosis not present

## 2024-10-22 ENCOUNTER — Encounter (HOSPITAL_COMMUNITY): Payer: Self-pay | Admitting: Internal Medicine

## 2024-10-24 ENCOUNTER — Telehealth: Payer: Self-pay

## 2024-10-24 NOTE — Telephone Encounter (Signed)
 Procedure:COLON Procedure date: 10/29/24 Procedure location: [facility] Arrival Time: 7:35 Spoke with the patient Y/N: Y Any prep concerns? N  Has the patient obtained the prep from the pharmacy ? Y Do you have a care partner and transportation: Y Any additional concerns? N

## 2024-10-29 ENCOUNTER — Encounter: Payer: Self-pay | Admitting: Internal Medicine

## 2024-10-29 ENCOUNTER — Ambulatory Visit (HOSPITAL_COMMUNITY): Admitting: Certified Registered Nurse Anesthetist

## 2024-10-29 ENCOUNTER — Other Ambulatory Visit: Payer: Self-pay

## 2024-10-29 ENCOUNTER — Encounter (HOSPITAL_COMMUNITY)
Admission: RE | Disposition: A | Discharge status: Home / Self Care | Payer: Self-pay | Source: Home / Self Care | Attending: Internal Medicine

## 2024-10-29 ENCOUNTER — Encounter (HOSPITAL_COMMUNITY): Payer: Self-pay | Admitting: Internal Medicine

## 2024-10-29 ENCOUNTER — Ambulatory Visit (HOSPITAL_COMMUNITY)
Admission: RE | Admit: 2024-10-29 | Discharge: 2024-10-29 | Disposition: A | Discharge status: Home / Self Care | Attending: Internal Medicine | Admitting: Internal Medicine

## 2024-10-29 DIAGNOSIS — K648 Other hemorrhoids: Secondary | ICD-10-CM

## 2024-10-29 DIAGNOSIS — E119 Type 2 diabetes mellitus without complications: Secondary | ICD-10-CM | POA: Insufficient documentation

## 2024-10-29 DIAGNOSIS — Z1211 Encounter for screening for malignant neoplasm of colon: Secondary | ICD-10-CM

## 2024-10-29 DIAGNOSIS — Z860101 Personal history of adenomatous and serrated colon polyps: Secondary | ICD-10-CM

## 2024-10-29 DIAGNOSIS — Z860100 Personal history of colon polyps, unspecified: Secondary | ICD-10-CM

## 2024-10-29 HISTORY — PX: COLONOSCOPY: SHX5424

## 2024-10-29 LAB — GLUCOSE, CAPILLARY: Glucose-Capillary: 100 mg/dL — ABNORMAL HIGH (ref 70–99)

## 2024-10-29 SURGERY — COLONOSCOPY
Anesthesia: Monitor Anesthesia Care

## 2024-10-29 MED ORDER — PROPOFOL 10 MG/ML IV BOLUS
INTRAVENOUS | Status: DC | PRN
  Administered 2024-10-29: 20 mg via INTRAVENOUS

## 2024-10-29 MED ORDER — LIDOCAINE HCL (CARDIAC) PF 100 MG/5ML IV SOSY
PREFILLED_SYRINGE | INTRAVENOUS | Status: DC | PRN
  Administered 2024-10-29: 100 mg via INTRAVENOUS

## 2024-10-29 MED ORDER — PROPOFOL 500 MG/50ML IV EMUL
INTRAVENOUS | Status: DC | PRN
  Administered 2024-10-29: 150 ug/kg/min via INTRAVENOUS

## 2024-10-29 MED ORDER — SODIUM CHLORIDE 0.9 % IV SOLN: INTRAVENOUS | Status: DC

## 2024-10-29 MED ORDER — LACTATED RINGERS IV SOLN: INTRAVENOUS | Status: DC | PRN

## 2024-10-29 MED ORDER — ONDANSETRON HCL 4 MG/2ML IJ SOLN
INTRAMUSCULAR | Status: DC | PRN
  Administered 2024-10-29: 4 mg via INTRAVENOUS

## 2024-10-29 NOTE — H&P (Signed)
 HISTORY OF PRESENT ILLNESS:  Shane Brewer is a 48 y.o. 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  Now for surveillance colonoscopy.  His procedures being performed in the hospital setting given his procedural high risk of BMI greater than 50.  He denies any active GI complaints  REVIEW OF SYSTEMS:  All non-GI ROS negative except for  Past Medical History:  Diagnosis Date   Arthritis    Chronic back pain    slipped disc and stenosis   Diabetes mellitus without complication (HCC)    History of migraine 6 yrs ago   Joint pain    Joint swelling    Seasonal allergies     Past Surgical History:  Procedure Laterality Date   CARPAL TUNNEL RELEASE Right 2023   SPINAL FUSION  2017   WRIST SURGERY Right 2000   screw   WRIST SURGERY Right 2023    Social History Shane Brewer  reports that he has never smoked. He has never used smokeless tobacco. He reports that he does not currently use alcohol. He reports that he does not use drugs.  family history includes Alcohol abuse in his maternal uncle and paternal grandfather; Arthritis in his father and mother; Cancer in his paternal grandmother; Colon polyps (age of onset: 67) in his father; Diabetes in his maternal grandmother; Heart disease in his brother, maternal grandmother, mother, and paternal grandfather; Hypertension in his father, maternal grandmother, and paternal grandfather; Stroke in his maternal grandmother.  Allergies  Allergen Reactions   Neosporin [Bacitracin-Polymyxin B] Rash   Strawberry Extract Hives       PHYSICAL EXAMINATION: Vital signs: BP (!) 142/90   Pulse 96   Temp (!) 97.4 F (36.3 C) (Temporal)   Resp 20   Ht 5' 9 (1.753 m)   Wt (!) 158.8 kg   SpO2 95%   BMI 51.69 kg/m  General: Well-developed, well-nourished, no acute distress HEENT: Sclerae are anicteric, conjunctiva pink. Oral mucosa intact Lungs: Clear Heart: Regular Abdomen: soft, nontender,  nondistended, no obvious ascites, no peritoneal signs, normal bowel sounds. No organomegaly. Extremities: No edema Psychiatric: alert and oriented x3. Cooperative     ASSESSMENT:  History of advanced polyp   PLAN:   Surveillance colonoscopy

## 2024-10-29 NOTE — Anesthesia Postprocedure Evaluation (Signed)
 Anesthesia Post Note  Patient: Shane Brewer  Procedure(s) Performed: COLONOSCOPY     Patient location during evaluation: PACU Anesthesia Type: MAC Level of consciousness: awake and alert Pain management: pain level controlled Vital Signs Assessment: post-procedure vital signs reviewed and stable Respiratory status: spontaneous breathing, nonlabored ventilation, respiratory function stable and patient connected to nasal cannula oxygen Cardiovascular status: stable and blood pressure returned to baseline Postop Assessment: no apparent nausea or vomiting Anesthetic complications: no   No notable events documented.  Last Vitals:  Vitals:   10/29/24 1026 10/29/24 1030  BP: (!) 122/51 (!) 119/53  Pulse: (!) 29 90  Resp: 18 19  Temp:    SpO2: 93% 94%    Last Pain:  Vitals:   10/29/24 1030  TempSrc:   PainSc: 0-No pain                 Thom JONELLE Peoples

## 2024-10-29 NOTE — Anesthesia Procedure Notes (Signed)
 Procedure Name: MAC Date/Time: 10/29/2024 9:29 AM  Performed by: Buster Catheryn SAUNDERS, CRNAPre-anesthesia Checklist: Patient identified, Emergency Drugs available, Suction available, Patient being monitored and Timeout performed Patient Re-evaluated:Patient Re-evaluated prior to induction Oxygen Delivery Method: Supernova nasal CPAP Preoxygenation: Pre-oxygenation with 100% oxygen Placement Confirmation: positive ETCO2

## 2024-10-29 NOTE — Discharge Instructions (Signed)

## 2024-10-29 NOTE — Op Note (Signed)
 [facility] Patient Name: Shane Brewer Procedure Date: 10/29/2024 MRN: [id number] Attending MD: Norleen SAILOR. Abran , MD, [id number] Date of Birth: 17-Mar-1976 CSN: [id number] Age: 48 Admit Type: Outpatient Procedure:                Colonoscopy Indications:              High risk colon cancer surveillance: Personal                            history of sessile serrated colon polyp (10 mm or                            greater in size). Previous exam 04-2022 Providers:                Norleen SAILOR. Abran, MD, Ozell Pouch, Felice Sar,                            Technician Referring MD:              Medicines:                Monitored Anesthesia Care Complications:            No immediate complications. Estimated blood loss:                            None. Estimated Blood Loss:     Estimated blood loss: none. Procedure:                Pre-Anesthesia Assessment:                           - Prior to the procedure, a History and Physical                            was performed, and patient medications and                            allergies were reviewed. The patient's tolerance of                            previous anesthesia was also reviewed. The risks                            and benefits of the procedure and the sedation                            options and risks were discussed with the patient.                            All questions were answered, and informed consent                            was obtained. Prior Anticoagulants: The patient has  taken no anticoagulant or antiplatelet agents. ASA                            Grade Assessment: III - A patient with severe                            systemic disease. After reviewing the risks and                            benefits, the patient was deemed in satisfactory                            condition to undergo the procedure.                           After obtaining informed consent, the  colonoscope                            was passed under direct vision. Throughout the                            procedure, the patient's blood pressure, pulse, and                            oxygen saturations were monitored continuously. The                            CF-HQ190L ([id number]) Olympus colonoscope was                            introduced through the anus and advanced to the the                            cecum, identified by appendiceal orifice and                            ileocecal valve. The ileocecal valve, appendiceal                            orifice, and rectum were photographed. The quality                            of the bowel preparation was excellent. The                            colonoscopy was performed without difficulty. The                            patient tolerated the procedure well. The bowel                            preparation used was SUPREP via split dose  instruction. Scope In: 9:42:48 AM Scope Out: 9:59:22 AM Scope Withdrawal Time: 0 hours 13 minutes 29 seconds  Total Procedure Duration: 0 hours 16 minutes 34 seconds  Findings:      Internal hemorrhoids were found during retroflexion. The hemorrhoids       were moderate.      The exam was otherwise without abnormality on direct and retroflexion       views. Impression:               - Internal hemorrhoids.                           - The examination was otherwise normal on direct                            and retroflexion views.                           - No specimens collected. Moderate Sedation:      none Recommendation:           - Repeat colonoscopy in 5 years for surveillance.                           - Patient has a contact number available for                            emergencies. The signs and symptoms of potential                            delayed complications were discussed with the                            patient. Return to normal  activities tomorrow.                            Written discharge instructions were provided to the                            patient.                           - Resume previous diet.                           - Continue present medications. Procedure Code(s):        --- Professional ---                           [phone], Colonoscopy, flexible; diagnostic, including                            collection of specimen(s) by brushing or washing,                            when performed (separate procedure) Diagnosis Code(s):        --- Professional ---  Z86.010, Personal history of colonic polyps                           K64.8, Other hemorrhoids CPT copyright 2022 American [employer] Association. All rights reserved. The codes documented in this report are preliminary and upon coder review may  be revised to meet current compliance requirements. Norleen SAILOR. Abran, MD 10/29/2024 10:11:49 AM This report has been signed electronically. Number of Addenda: 0

## 2024-10-29 NOTE — Anesthesia Preprocedure Evaluation (Addendum)
 Anesthesia Evaluation  Patient identified by MRN, date of birth, ID band Patient awake    Reviewed: Allergy & Precautions, H&P , NPO status , Patient's Chart, lab work & pertinent test results  History of Anesthesia Complications Negative for: history of anesthetic complications  Airway Mallampati: III  TM Distance: >3 FB Neck ROM: Full    Dental no notable dental hx.    Pulmonary neg pulmonary ROS, neg COPD   Pulmonary exam normal breath sounds clear to auscultation       Cardiovascular (-) angina (-) Past MI Normal cardiovascular exam Rhythm:Regular Rate:Normal     Neuro/Psych  Headaches, neg Seizures negative neurological ROS  negative psych ROS   GI/Hepatic Neg liver ROS,GERD  ,,  Endo/Other  diabetes, Type 2  Class 4 obesity  Renal/GU negative Renal ROS  negative genitourinary   Musculoskeletal  (+) Arthritis ,    Abdominal   Peds negative pediatric ROS (+)  Hematology negative hematology ROS (+)   Anesthesia Other Findings   Reproductive/Obstetrics negative OB ROS                              Anesthesia Physical Anesthesia Plan  ASA: 3  Anesthesia Plan: MAC   Post-op Pain Management:    Induction: Intravenous  PONV Risk Score and Plan: 1 and Propofol  infusion and Treatment may vary due to age or medical condition  Airway Management Planned: Natural Airway  Additional Equipment:   Intra-op Plan:   Post-operative Plan:   Informed Consent: I have reviewed the patients History and Physical, chart, labs and discussed the procedure including the risks, benefits and alternatives for the proposed anesthesia with the patient or authorized representative who has indicated his/her understanding and acceptance.     Dental advisory given  Plan Discussed with: CRNA  Anesthesia Plan Comments:          Anesthesia Quick Evaluation

## 2024-10-29 NOTE — Transfer of Care (Signed)
 Immediate Anesthesia Transfer of Care Note  Patient: Shane Brewer  Procedure(s) Performed: COLONOSCOPY  Patient Location: PACU  Anesthesia Type:MAC  Level of Consciousness: awake, alert , and oriented  Airway & Oxygen Therapy: Patient Spontanous Breathing  Post-op Assessment: Report given to RN and Post -op Vital signs reviewed and stable  Post vital signs: Reviewed and stable  Last Vitals:  Vitals Value Taken Time  BP 107/61 10/29/24 10:07  Temp    Pulse 95 10/29/24 10:08  Resp 23 10/29/24 10:08  SpO2 92 % 10/29/24 10:08  Vitals shown include unfiled device data.  Last Pain:  Vitals:   10/29/24 0826  TempSrc: Temporal  PainSc: 0-No pain         Complications: No notable events documented.

## 2024-10-31 ENCOUNTER — Encounter (HOSPITAL_COMMUNITY): Payer: Self-pay | Admitting: Internal Medicine
